# Patient Record
Sex: Female | Born: 1950
Health system: Southern US, Community
[De-identification: ages and names within clinical notes are randomized; demographics above are authoritative.]

## PROBLEM LIST (undated history)

## (undated) DIAGNOSIS — T7840XA Allergy, unspecified, initial encounter: Secondary | ICD-10-CM

## (undated) DIAGNOSIS — K219 Gastro-esophageal reflux disease without esophagitis: Secondary | ICD-10-CM

## (undated) DIAGNOSIS — C801 Malignant (primary) neoplasm, unspecified: Secondary | ICD-10-CM

## (undated) DIAGNOSIS — R011 Cardiac murmur, unspecified: Secondary | ICD-10-CM

## (undated) DIAGNOSIS — I10 Essential (primary) hypertension: Secondary | ICD-10-CM

## (undated) HISTORY — DX: Cardiac murmur, unspecified: R01.1

## (undated) HISTORY — DX: Malignant (primary) neoplasm, unspecified: C80.1

## (undated) HISTORY — DX: Allergy, unspecified, initial encounter: T78.40XA

## (undated) HISTORY — DX: Essential (primary) hypertension: I10

## (undated) HISTORY — DX: Gastro-esophageal reflux disease without esophagitis: K21.9

---

## 1997-08-31 ENCOUNTER — Emergency Department (HOSPITAL_COMMUNITY): Admission: EM | Admit: 1997-08-31 | Discharge: 1997-08-31 | Payer: Self-pay | Admitting: Emergency Medicine

## 1999-05-10 ENCOUNTER — Other Ambulatory Visit: Admission: RE | Admit: 1999-05-10 | Discharge: 1999-05-10 | Payer: Self-pay | Admitting: Obstetrics and Gynecology

## 2000-06-06 ENCOUNTER — Other Ambulatory Visit: Admission: RE | Admit: 2000-06-06 | Discharge: 2000-06-06 | Payer: Self-pay | Admitting: Obstetrics and Gynecology

## 2001-08-13 ENCOUNTER — Other Ambulatory Visit: Admission: RE | Admit: 2001-08-13 | Discharge: 2001-08-13 | Payer: Self-pay | Admitting: Obstetrics and Gynecology

## 2002-09-22 ENCOUNTER — Other Ambulatory Visit: Admission: RE | Admit: 2002-09-22 | Discharge: 2002-09-22 | Payer: Self-pay | Admitting: Obstetrics and Gynecology

## 2003-05-03 ENCOUNTER — Emergency Department (HOSPITAL_COMMUNITY): Admission: EM | Admit: 2003-05-03 | Discharge: 2003-05-03 | Payer: Self-pay | Admitting: Emergency Medicine

## 2003-10-20 ENCOUNTER — Other Ambulatory Visit: Admission: RE | Admit: 2003-10-20 | Discharge: 2003-10-20 | Payer: Self-pay | Admitting: Obstetrics and Gynecology

## 2004-11-15 ENCOUNTER — Other Ambulatory Visit: Admission: RE | Admit: 2004-11-15 | Discharge: 2004-11-15 | Payer: Self-pay | Admitting: Obstetrics and Gynecology

## 2004-11-15 ENCOUNTER — Ambulatory Visit: Payer: Self-pay | Admitting: Internal Medicine

## 2005-07-11 ENCOUNTER — Ambulatory Visit: Payer: Self-pay | Admitting: Internal Medicine

## 2005-07-16 ENCOUNTER — Ambulatory Visit: Payer: Self-pay | Admitting: Internal Medicine

## 2005-10-17 ENCOUNTER — Ambulatory Visit: Payer: Self-pay | Admitting: Internal Medicine

## 2005-10-24 ENCOUNTER — Ambulatory Visit: Payer: Self-pay | Admitting: Internal Medicine

## 2006-01-16 ENCOUNTER — Ambulatory Visit: Payer: Self-pay | Admitting: Internal Medicine

## 2006-04-17 ENCOUNTER — Ambulatory Visit: Payer: Self-pay | Admitting: Internal Medicine

## 2006-07-17 ENCOUNTER — Ambulatory Visit: Payer: Self-pay | Admitting: Internal Medicine

## 2006-11-14 ENCOUNTER — Ambulatory Visit: Payer: Self-pay | Admitting: Internal Medicine

## 2007-03-27 ENCOUNTER — Encounter: Payer: Self-pay | Admitting: Internal Medicine

## 2007-03-27 DIAGNOSIS — J309 Allergic rhinitis, unspecified: Secondary | ICD-10-CM

## 2007-03-27 DIAGNOSIS — I10 Essential (primary) hypertension: Secondary | ICD-10-CM | POA: Insufficient documentation

## 2007-03-27 DIAGNOSIS — J209 Acute bronchitis, unspecified: Secondary | ICD-10-CM

## 2007-05-16 ENCOUNTER — Telehealth: Payer: Self-pay | Admitting: Internal Medicine

## 2008-03-17 ENCOUNTER — Telehealth: Payer: Self-pay | Admitting: Internal Medicine

## 2008-03-22 ENCOUNTER — Ambulatory Visit: Payer: Self-pay | Admitting: Internal Medicine

## 2008-03-23 LAB — CONVERTED CEMR LAB
ALT: 20 units/L (ref 0–35)
AST: 21 units/L (ref 0–37)
Albumin: 3.8 g/dL (ref 3.5–5.2)
Alkaline Phosphatase: 63 units/L (ref 39–117)
BUN: 19 mg/dL (ref 6–23)
Basophils Absolute: 0.1 10*3/uL (ref 0.0–0.1)
Basophils Relative: 0.9 % (ref 0.0–3.0)
Bilirubin Urine: NEGATIVE
Bilirubin, Direct: 0.1 mg/dL (ref 0.0–0.3)
CO2: 26 meq/L (ref 19–32)
Calcium: 9.3 mg/dL (ref 8.4–10.5)
Chloride: 104 meq/L (ref 96–112)
Cholesterol: 109 mg/dL (ref 0–200)
Creatinine, Ser: 1 mg/dL (ref 0.4–1.2)
Crystals: NEGATIVE
Eosinophils Absolute: 0.3 10*3/uL (ref 0.0–0.7)
Eosinophils Relative: 3.9 % (ref 0.0–5.0)
GFR calc Af Amer: 74 mL/min
GFR calc non Af Amer: 61 mL/min
Glucose, Bld: 85 mg/dL (ref 70–99)
HCT: 35.2 % — ABNORMAL LOW (ref 36.0–46.0)
HDL: 55.5 mg/dL (ref >39.0)
Hemoglobin, Urine: NEGATIVE
Hemoglobin: 12.3 g/dL (ref 12.0–15.0)
Hgb A1c MFr Bld: 6.2 % — ABNORMAL HIGH (ref 4.6–6.0)
Ketones, ur: NEGATIVE mg/dL
LDL Cholesterol: 44 mg/dL (ref 0–99)
Lymphocytes Relative: 30.7 % (ref 12.0–46.0)
MCHC: 34.9 g/dL (ref 30.0–36.0)
MCV: 90.2 fL (ref 78.0–100.0)
Monocytes Absolute: 0.5 10*3/uL (ref 0.1–1.0)
Monocytes Relative: 6.3 % (ref 3.0–12.0)
Mucus, UA: NEGATIVE
Neutro Abs: 4.3 10*3/uL (ref 1.4–7.7)
Neutrophils Relative %: 58.2 % (ref 43.0–77.0)
Nitrite: NEGATIVE
Platelets: 240 10*3/uL (ref 150–400)
Potassium: 4.3 meq/L (ref 3.5–5.1)
RBC / HPF: NONE SEEN
RBC: 3.9 M/uL (ref 3.87–5.11)
RDW: 13.5 % (ref 11.5–14.6)
Sodium: 138 meq/L (ref 135–145)
Specific Gravity, Urine: <=1.005 (ref 1.000–1.03)
TSH: 2.7 microintl units/mL (ref 0.35–5.50)
Total Bilirubin: 0.6 mg/dL (ref 0.3–1.2)
Total CHOL/HDL Ratio: 2
Total Protein, Urine: NEGATIVE mg/dL
Total Protein: 6.9 g/dL (ref 6.0–8.3)
Triglycerides: 48 mg/dL (ref 0–149)
Urine Glucose: NEGATIVE mg/dL
Urobilinogen, UA: 0.2 (ref 0.0–1.0)
VLDL: 10 mg/dL (ref 0–40)
WBC: 7.5 10*3/uL (ref 4.5–10.5)
pH: 5.5 (ref 5.0–8.0)

## 2008-03-24 ENCOUNTER — Ambulatory Visit: Payer: Self-pay | Admitting: Internal Medicine

## 2008-03-24 DIAGNOSIS — R7309 Other abnormal glucose: Secondary | ICD-10-CM

## 2008-06-07 ENCOUNTER — Telehealth: Payer: Self-pay | Admitting: Internal Medicine

## 2008-08-05 ENCOUNTER — Telehealth: Payer: Self-pay | Admitting: Internal Medicine

## 2009-05-11 ENCOUNTER — Ambulatory Visit: Payer: Self-pay | Admitting: Internal Medicine

## 2009-05-11 DIAGNOSIS — J040 Acute laryngitis: Secondary | ICD-10-CM | POA: Insufficient documentation

## 2009-05-12 ENCOUNTER — Ambulatory Visit: Payer: Self-pay | Admitting: Internal Medicine

## 2009-05-12 LAB — CONVERTED CEMR LAB
ALT: 16 units/L (ref 0–35)
AST: 19 units/L (ref 0–37)
Albumin: 3.5 g/dL (ref 3.5–5.2)
Alkaline Phosphatase: 59 units/L (ref 39–117)
BUN: 12 mg/dL (ref 6–23)
Basophils Absolute: 0 10*3/uL (ref 0.0–0.1)
Basophils Relative: 0.6 % (ref 0.0–3.0)
Bilirubin Urine: NEGATIVE
Bilirubin, Direct: 0.2 mg/dL (ref 0.0–0.3)
CO2: 28 meq/L (ref 19–32)
Calcium: 9.2 mg/dL (ref 8.4–10.5)
Chloride: 106 meq/L (ref 96–112)
Cholesterol: 94 mg/dL (ref 0–200)
Creatinine, Ser: 0.9 mg/dL (ref 0.4–1.2)
Eosinophils Absolute: 0.3 10*3/uL (ref 0.0–0.7)
Eosinophils Relative: 5 % (ref 0.0–5.0)
GFR calc non Af Amer: 83.1 mL/min (ref >60)
Glucose, Bld: 93 mg/dL (ref 70–99)
HCT: 34.6 % — ABNORMAL LOW (ref 36.0–46.0)
HDL: 56.5 mg/dL (ref >39.00)
Hemoglobin, Urine: NEGATIVE
Hemoglobin: 11.2 g/dL — ABNORMAL LOW (ref 12.0–15.0)
Hgb A1c MFr Bld: 6.3 % (ref 4.6–6.5)
Ketones, ur: NEGATIVE mg/dL
LDL Cholesterol: 31 mg/dL (ref 0–99)
Leukocytes, UA: NEGATIVE
Lymphocytes Relative: 28.5 % (ref 12.0–46.0)
Lymphs Abs: 1.9 10*3/uL (ref 0.7–4.0)
MCHC: 32.4 g/dL (ref 30.0–36.0)
MCV: 90.7 fL (ref 78.0–100.0)
Monocytes Absolute: 0.3 10*3/uL (ref 0.1–1.0)
Monocytes Relative: 4.6 % (ref 3.0–12.0)
Neutro Abs: 4.2 10*3/uL (ref 1.4–7.7)
Neutrophils Relative %: 61.3 % (ref 43.0–77.0)
Nitrite: NEGATIVE
Platelets: 228 10*3/uL (ref 150.0–400.0)
Potassium: 3.8 meq/L (ref 3.5–5.1)
RBC: 3.82 M/uL — ABNORMAL LOW (ref 3.87–5.11)
RDW: 13.1 % (ref 11.5–14.6)
Sodium: 140 meq/L (ref 135–145)
Specific Gravity, Urine: <=1.005 (ref 1.000–1.030)
TSH: 1.74 microintl units/mL (ref 0.35–5.50)
Total Bilirubin: 0.7 mg/dL (ref 0.3–1.2)
Total CHOL/HDL Ratio: 2
Total Protein, Urine: NEGATIVE mg/dL
Total Protein: 7 g/dL (ref 6.0–8.3)
Triglycerides: 32 mg/dL (ref 0.0–149.0)
Urine Glucose: NEGATIVE mg/dL
Urobilinogen, UA: 0.2 (ref 0.0–1.0)
VLDL: 6.4 mg/dL (ref 0.0–40.0)
WBC: 6.7 10*3/uL (ref 4.5–10.5)
pH: 6.5 (ref 5.0–8.0)

## 2009-08-09 ENCOUNTER — Telehealth: Payer: Self-pay | Admitting: Internal Medicine

## 2009-08-09 ENCOUNTER — Encounter: Payer: Self-pay | Admitting: Internal Medicine

## 2010-06-07 ENCOUNTER — Ambulatory Visit: Admit: 2010-06-07 | Payer: Self-pay | Admitting: Internal Medicine

## 2010-06-07 ENCOUNTER — Ambulatory Visit (INDEPENDENT_AMBULATORY_CARE_PROVIDER_SITE_OTHER): Payer: BC Managed Care – PPO | Admitting: Internal Medicine

## 2010-06-07 ENCOUNTER — Other Ambulatory Visit: Payer: Self-pay | Admitting: Internal Medicine

## 2010-06-07 ENCOUNTER — Encounter: Payer: Self-pay | Admitting: Internal Medicine

## 2010-06-07 DIAGNOSIS — I10 Essential (primary) hypertension: Secondary | ICD-10-CM

## 2010-06-07 DIAGNOSIS — J309 Allergic rhinitis, unspecified: Secondary | ICD-10-CM

## 2010-06-07 DIAGNOSIS — R7309 Other abnormal glucose: Secondary | ICD-10-CM

## 2010-06-07 DIAGNOSIS — R635 Abnormal weight gain: Secondary | ICD-10-CM | POA: Insufficient documentation

## 2010-06-07 LAB — BASIC METABOLIC PANEL
BUN: 19 mg/dL (ref 6–23)
CO2: 28 mEq/L (ref 19–32)
Calcium: 9.2 mg/dL (ref 8.4–10.5)
Chloride: 106 mEq/L (ref 96–112)
Creatinine, Ser: 1 mg/dL (ref 0.4–1.2)
GFR: 77.78 mL/min (ref >60.00)
Glucose, Bld: 93 mg/dL (ref 70–99)
Potassium: 4.5 mEq/L (ref 3.5–5.1)
Sodium: 139 mEq/L (ref 135–145)

## 2010-06-07 LAB — HEPATIC FUNCTION PANEL
ALT: 18 U/L (ref 0–35)
AST: 18 U/L (ref 0–37)
Albumin: 3.6 g/dL (ref 3.5–5.2)
Alkaline Phosphatase: 59 U/L (ref 39–117)
Bilirubin, Direct: 0.1 mg/dL (ref 0.0–0.3)
Total Bilirubin: 0.3 mg/dL (ref 0.3–1.2)
Total Protein: 6.7 g/dL (ref 6.0–8.3)

## 2010-06-07 LAB — HEMOGLOBIN A1C: Hgb A1c MFr Bld: 6.5 % (ref 4.6–6.5)

## 2010-06-07 LAB — TSH: TSH: 1.94 u[IU]/mL (ref 0.35–5.50)

## 2010-06-08 NOTE — Assessment & Plan Note (Signed)
Summary: MEDS---FU---STC     Vital Signs:  Patient profile:   60 year old female Height:      68 inches Weight:      206 pounds BMI:     31.44 Temp:     97.9 degrees F oral Pulse rate:   53 / minute BP sitting:   134 / 82  (left arm)  Vitals Entered By: Tora Perches (May 11, 2009 9:32 AM) CC: f/u Is Patient Diabetic? No   CC:  f/u.  History of Present Illness: F/u HTN and asthma The patient presents with complaints of sore throat, fever, cough, sinus congestion and drainge of several days duration. Not better with OTC meds. Hoarse.Muscle aches are present.  The mucus is colored.   Preventive Screening-Counseling & Management  Alcohol-Tobacco     Smoking Status: never  Current Medications (verified): 1)  Spironolactone 100 Mg Tabs (Spironolactone) .Marland Kitchen.. 1 By Mouth Qd 2)  Advair Diskus 250-50 Mcg/dose Misc (Fluticasone-Salmeterol) .Marland Kitchen.. 1 Puff 2 Times Daily 3)  Allegra 180 Mg Tabs (Fexofenadine Hcl) .Marland Kitchen.. 1 By Mouth Once Daily Prn 4)  Vitamin D3 1000 Unit  Tabs (Cholecalciferol) .Marland Kitchen.. 1 By Mouth Daily 5)  Amlodipine Besylate 10 Mg  Tabs (Amlodipine Besylate) .Marland Kitchen.. 1 Once Daily 6)  Carvedilol 25 Mg Tabs (Carvedilol) .Marland Kitchen.. 1 By Mouth Bid  Allergies: 1)  ! Ace Inhibitors 2)  ! 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    Physical Exam  General:  Well-developed,well-nourished,in no acute distress; alert,appropriate and cooperative throughout; sonme examinationoverweight-appearing.   Mouth:  Erythematous throat mucosa and intranasal erythema.  Lungs:  Normal respiratory effort, chest expands symmetrically. Lungs are clear to auscultation, no crackles or wheezes. Heart:  Normal rate and regular rhythm. S1 and S2 normal without gallop, murmur,  click, rub or other extra sounds. Msk:  No deformity or scoliosis noted of thoracic or lumbar spine.   Extremities:  No clubbing, cyanosis, edema, or deformity noted with normal full range of motion of all joints.   Neurologic:  No cranial nerve deficits noted. Station and gait are normal. Plantar reflexes are down-going bilaterally. DTRs are symmetrical throughout. Sensory, motor and coordinative functions appear intact. Skin:  Intact without suspicious lesions or rashes Psych:  Cognition and judgment appear intact. Alert and cooperative with normal attention span and concentration. No apparent delusions, illusions, hallucinations   Impression & Recommendations:  Problem # 1:  HYPERTENSION (ICD-401.9) Assessment Unchanged  Her updated medication list for this problem includes:    Spironolactone 100 Mg Tabs (Spironolactone) .Marland Kitchen... 1 by mouth qd    Amlodipine Besylate 10 Mg Tabs (Amlodipine besylate) .Marland Kitchen... 1 once daily    Carvedilol 25 Mg Tabs (Carvedilol) .Marland Kitchen... 1 by mouth bid  BP today: 134/82 Prior BP: 130/100 (03/24/2008)  Labs Reviewed: K+: 4.3 (03/22/2008) Creat: : 1.0 (03/22/2008)   Chol: 109 (03/22/2008)   HDL: 55.5 (03/22/2008)   LDL: 44 (03/22/2008)   TG: 48 (03/22/2008)  Problem # 2:  ASTHMATIC BRONCHITIS, ACUTE (ICD-466.0) Assessment: Deteriorated  Her updated medication list for this problem includes:    Advair Diskus 250-50 Mcg/dose Misc (Fluticasone-salmeterol) .Marland Kitchen... 1 puff 2 times daily    Zithromax Z-pak 250 Mg Tabs (Azithromycin) .Marland Kitchen... As dirrected  Problem # 3:  ALLERGIC RHINITIS (ICD-477.9) Assessment: Deteriorated  The following medications were removed  from the medication list:    Allegra 180 Mg Tabs (Fexofenadine hcl) .Marland Kitchen... 1 by mouth once daily prn Her updated medication list for this problem includes:    Loratadine 10 Mg Tabs (Loratadine) .Marland Kitchen... 1 by mouth once daily as needed allergies  Problem # 4:  LARYNGITIS, ACUTE (ICD-464.00) Assessment:  New  Complete Medication List: 1)  Spironolactone 100 Mg Tabs (Spironolactone) .Marland Kitchen.. 1 by mouth qd 2)  Advair Diskus 250-50 Mcg/dose Misc (Fluticasone-salmeterol) .Marland Kitchen.. 1 puff 2 times daily 3)  Vitamin D3 1000 Unit Tabs (Cholecalciferol) .Marland Kitchen.. 1 by mouth daily 4)  Amlodipine Besylate 10 Mg Tabs (Amlodipine besylate) .Marland Kitchen.. 1 once daily 5)  Carvedilol 25 Mg Tabs (Carvedilol) .Marland Kitchen.. 1 by mouth bid 6)  Zithromax Z-pak 250 Mg Tabs (Azithromycin) .... As dirrected 7)  Loratadine 10 Mg Tabs (Loratadine) .Marland Kitchen.. 1 by mouth once daily as needed allergies  Contraindications/Deferment of Procedures/Staging:    Treatment: Flu Shot    Contraindication: N/A     Test/Procedure: Pneumovax vaccine    Reason for deferment: patient declined   Patient Instructions: 1)  Try to eat more raw plant food, fresh and dry fruit, raw almonds, leafy vegetables, whole foods and less red meat, less animal fat. Poultry and fish is better for you than pork and beef. Avoid processed foods (canned soups, hot dogs, sausage, bacon , frozen dinners). Avoid corn syrup, high fructose syrup or aspartam and Splenda  containing drinks. Honey, Agave and Stevia are better sweeteners. Make your own  dressing with olive oil, wine vinegar, lemon juce, garlic etc. for your salads. 2)  Voice rest 3)  Use stretching exercises that I have provided (15 min. or longer every day) 4)  Labs next wk 5)  BMP prior to visit, ICD-9: 6)  Hepatic Panel prior to visit, ICD-9: 7)  Lipid Panel prior to visit, ICD-9: 8)  TSH prior to visit, ICD-9: 9)  CBC w/ Diff prior to visit, ICD-9: 10)  Urine-dip prior to visit, ICD-9: 11)  HbgA1C prior to visit, ICD-9: 12)  V70.0 Prescriptions: LORATADINE 10 MG TABS (LORATADINE) 1 by mouth once daily as needed allergies  #90 x 3   Entered and Authorized by:   Tresa Garter MD   Signed by:   Tresa Garter MD on 05/11/2009   Method used:   Print then Give to Patient   RxID:   [id number] CARVEDILOL 25  MG TABS (CARVEDILOL) 1 by mouth bid  #180 x 3   Entered and Authorized by:   Tresa Garter MD   Signed by:   Tresa Garter MD on 05/11/2009   Method used:   Print then Give to Patient   RxID:   [id number] AMLODIPINE BESYLATE 10 MG  TABS (AMLODIPINE BESYLATE) 1 once daily  #90 x 3   Entered and Authorized by:   Tresa Garter MD   Signed by:   Tresa Garter MD on 05/11/2009   Method used:   Print then Give to Patient   RxID:   [id number] ADVAIR DISKUS 250-50 MCG/DOSE MISC (FLUTICASONE-SALMETEROL) 1 puff 2 times daily  #3 x 3   Entered and Authorized by:   Tresa Garter MD   Signed by:   Tresa Garter MD on 05/11/2009   Method used:   Print then Give to Patient   RxID:   [id number] SPIRONOLACTONE 100 MG TABS (SPIRONOLACTONE) 1 by mouth qd  #90 x 3   Entered and Authorized by:   Georgina Quint  Plotnikov MD   Signed by:   Tresa Garter MD on 05/11/2009   Method used:   Print then Give to Patient   RxID:   [id number] ZITHROMAX Z-PAK 250 MG TABS (AZITHROMYCIN) as dirrected  #1 x 0   Entered and Authorized by:   Tresa Garter MD   Signed by:   Tresa Garter MD on 05/11/2009   Method used:   Print then Give to Patient   RxID:   [id number]

## 2010-06-08 NOTE — Progress Notes (Signed)
Summary: ALLERGY LETTER  Phone Note Call from Patient Call back at [phone] till 11:30   Summary of Call: Patient is requesting a letter that she suffers from allergies w/her triggers, for her file at work. (Triggers she says are "fuzzy stuff") Initial call taken by: Lamar Sprinkles, CMA,  August 09, 2009 10:26 AM  Follow-up for Phone Call        OK Follow-up by: Tresa Garter MD,  August 09, 2009 1:15 PM  Additional Follow-up for Phone Call Additional follow up Details #1::        Letter ready, left mess to call office back..................Marland KitchenLamar Sprinkles, CMA  August 09, 2009 2:36 PM     Additional Follow-up for Phone Call Additional follow up Details #2::    left message on machine to call back to office. Lucious Groves  August 10, 2009 9:22 AM   patient notified. Lucious Groves  August 11, 2009 8:27 AM

## 2010-06-08 NOTE — Letter (Signed)
Summary: Generic Letter  [facility]  [address] [place], [place] [address]   Phone: [phone]  Fax: [phone]    08/09/2009  RE: LAKYN ALSTEEN [address] [place], [place]  [address]    Ms. Betton si suffering with chronic allergic rhinitis. Some allergy triggers are present at her work place.           Sincerely,   Jacinta Shoe MD

## 2010-06-14 NOTE — Assessment & Plan Note (Signed)
Summary: FOLLOW UP  LB   Vital Signs:  Patient profile:   60 year old female Height:      68 inches Weight:      223 pounds BMI:     34.03 Temp:     98.3 degrees F oral Pulse rate:   76 / minute Pulse rhythm:   regular Resp:     16 per minute BP sitting:   110 / 68  (left arm) Cuff size:   regular  Vitals Entered By: Lanier Prude, CMA(AAMA) (June 07, 2010 9:30 AM) CC: f/u  Comments pt needs Rf on Spironolactone.  She is not taking Loratadine   CC:  f/u .  History of Present Illness: The patient presents for a follow up of hypertension, allergies, asthma. C/o stress.  Current Medications (verified): 1)  Spironolactone 100 Mg Tabs (Spironolactone) .Marland Kitchen.. 1 By Mouth Qd 2)  Advair Diskus 250-50 Mcg/dose Misc (Fluticasone-Salmeterol) .Marland Kitchen.. 1 Puff 2 Times Daily 3)  Vitamin D3 1000 Unit  Tabs (Cholecalciferol) .Marland Kitchen.. 1 By Mouth Daily 4)  Amlodipine Besylate 10 Mg  Tabs (Amlodipine Besylate) .Marland Kitchen.. 1 Once Daily 5)  Carvedilol 25 Mg Tabs (Carvedilol) .Marland Kitchen.. 1 By Mouth Bid 6)  Loratadine 10 Mg Tabs (Loratadine) .Marland Kitchen.. 1 By Mouth Once Daily As Needed Allergies  Allergies (verified): 1)  ! Ace Inhibitors 2)  ! 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   Eyes:  No corneal or conjunctival inflammation noted. EOMI. Perrla. Mouth:  Erythematous throat mucosa and intranasal erythema.  Lungs:  Normal respiratory effort, chest expands symmetrically. Lungs are clear to auscultation, no crackles or wheezes. Heart:  Normal rate and regular rhythm. S1 and S2 normal without gallop, murmur, click, rub or other extra sounds. Msk:  No deformity or scoliosis noted of thoracic or lumbar spine.     Neurologic:  No cranial nerve deficits noted. Station and gait are normal. Plantar reflexes are down-going bilaterally. DTRs are symmetrical throughout. Sensory, motor and coordinative functions appear intact. Skin:  Intact without suspicious lesions or rashes Psych:  Cognition and judgment appear intact. Alert and cooperative with normal attention span and concentration. No apparent delusions, illusions, hallucinations   Impression & Recommendations:  Problem # 1:  HYPERTENSION (ICD-401.9) Assessment Improved  The following medications were removed from the medication list:    Spironolactone 100 Mg Tabs (Spironolactone) .Marland Kitchen... 1 by mouth qd Her updated medication list for this problem includes:    Amlodipine Besylate 10 Mg Tabs (Amlodipine besylate) .Marland Kitchen... 1 once daily    Carvedilol 25 Mg Tabs (Carvedilol) .Marland Kitchen... 1 by mouth bid  Orders: TLB-BMP (Basic Metabolic Panel-BMET) (80048-METABOL) TLB-A1C / Hgb A1C (Glycohemoglobin) (83036-A1C) TLB-Hepatic/Liver Function Pnl (80076-HEPATIC) TLB-TSH (Thyroid Stimulating Hormone) (84443-TSH)  Problem # 2:  ABNORMAL GLUCOSE NEC (ICD-790.29) Assessment: Comment Only  Orders: TLB-BMP (Basic Metabolic Panel-BMET) (80048-METABOL) TLB-A1C / Hgb A1C (Glycohemoglobin) (83036-A1C) TLB-Hepatic/Liver Function Pnl (80076-HEPATIC) TLB-TSH (Thyroid Stimulating Hormone) (84443-TSH)  Problem # 3:  ALLERGIC RHINITIS (ICD-477.9) Assessment: Improved  Her updated medication list for this problem includes:    Loratadine 10 Mg Tabs (Loratadine) .Marland Kitchen... 1 by mouth once daily as needed allergies  Orders: TLB-BMP (Basic Metabolic Panel-BMET) (80048-METABOL) TLB-A1C / Hgb A1C (Glycohemoglobin) (83036-A1C) TLB-Hepatic/Liver Function Pnl (80076-HEPATIC) TLB-TSH (Thyroid Stimulating Hormone) (84443-TSH)  Problem # 4:  WEIGHT GAIN, ABNORMAL (  ICD-783.1)/obesity Assessment: Deteriorated She wants to try Phentermine Risks vs benefits and controversies of a long  term Phentermine use were discussed.   Complete Medication List: 1)  Amlodipine Besylate 10 Mg Tabs (Amlodipine besylate) .Marland Kitchen.. 1 once daily 2)  Carvedilol 25 Mg Tabs (Carvedilol) .Marland Kitchen.. 1 by mouth bid 3)  Loratadine 10 Mg Tabs (Loratadine) .Marland Kitchen.. 1 by mouth once daily as needed allergies 4)  Vitamin D3 1000 Unit Tabs (Cholecalciferol) .Marland Kitchen.. 1 by mouth daily 5)  Phentermine Hcl 37.5 Mg Tabs (Phentermine hcl) .Marland Kitchen.. 1 by mouth qam  Patient Instructions: 1)  Please schedule a follow-up appointment in 3 months well w/labs. Prescriptions: PHENTERMINE HCL 37.5 MG TABS (PHENTERMINE HCL) 1 by mouth qam  #30 x 2   Entered and Authorized by:   Tresa Garter MD   Signed by:   Tresa Garter MD on 06/07/2010   Method used:   Print then Give to Patient   RxID:   [id number] LORATADINE 10 MG TABS (LORATADINE) 1 by mouth once daily as needed allergies  #90 x 3   Entered and Authorized by:   Tresa Garter MD   Signed by:   Tresa Garter MD on 06/07/2010   Method used:   Print then Give to Patient   RxID:   [id number] CARVEDILOL 25 MG TABS (CARVEDILOL) 1 by mouth bid  #60 x 11   Entered and Authorized by:   Tresa Garter MD   Signed by:   Tresa Garter MD on 06/07/2010   Method used:   Electronically to        [facility] [address] [phone]* (retail)       [address]       [place], [place]  [address]       Ph: [phone]       Fax: [phone]   RxID:   [id number] AMLODIPINE BESYLATE 10 MG  TABS (AMLODIPINE BESYLATE) 1 once daily  #30 x 11   Entered and Authorized by:   Tresa Garter MD   Signed by:   Tresa Garter MD on 06/07/2010   Method used:   Electronically to        [facility] [address] [phone]* (retail)       [address]       [place], [place]  [address]       Ph: [phone]       Fax: [phone]   RxID:   [id number]    Orders Added: 1)  Est. Patient Level IV [07371] 2)  TLB-BMP (Basic Metabolic Panel-BMET)  [80048-METABOL] 3)  TLB-A1C / Hgb A1C (Glycohemoglobin) [83036-A1C] 4)  TLB-Hepatic/Liver Function Pnl [80076-HEPATIC] 5)  TLB-TSH (Thyroid Stimulating Hormone) [06269-SWN]

## 2010-08-15 ENCOUNTER — Telehealth: Payer: Self-pay | Admitting: *Deleted

## 2010-08-15 NOTE — Telephone Encounter (Signed)
Pt c/o chest congestion. No cough, fever, body aches, chills or pain. She is taking otc mucus relief and allegra D which has helped some today. I advised her to continue what she is taking now, rest and call office if no better or if worse. She agreed.

## 2010-08-15 NOTE — Telephone Encounter (Signed)
Agree. Thx 

## 2010-08-21 ENCOUNTER — Telehealth: Payer: Self-pay | Admitting: *Deleted

## 2010-08-21 NOTE — Telephone Encounter (Signed)
See previous phone note, pt states she is feeling worse and req rx for abx from MD. OTC med have given no relief.

## 2010-08-21 NOTE — Telephone Encounter (Signed)
Not sure... Needs OV Thx

## 2010-08-22 ENCOUNTER — Telehealth: Payer: Self-pay | Admitting: Internal Medicine

## 2010-08-22 NOTE — Telephone Encounter (Signed)
Called and left message w/Pt's mother to inform OV needed.

## 2010-08-22 NOTE — Telephone Encounter (Signed)
Duplicate. Error

## 2010-08-23 ENCOUNTER — Encounter: Payer: Self-pay | Admitting: Internal Medicine

## 2010-08-23 ENCOUNTER — Ambulatory Visit (INDEPENDENT_AMBULATORY_CARE_PROVIDER_SITE_OTHER): Payer: BC Managed Care – PPO | Admitting: Internal Medicine

## 2010-08-23 DIAGNOSIS — J069 Acute upper respiratory infection, unspecified: Secondary | ICD-10-CM

## 2010-08-27 ENCOUNTER — Encounter: Payer: Self-pay | Admitting: Internal Medicine

## 2010-08-27 NOTE — Progress Notes (Signed)
  Subjective:    Patient ID: Molly Newman, female    DOB: 1950/07/15, 60 y.o.   MRN: [id number]  HPI  C/o nasal congestion, ST and cough x 1 wk  HPI  C/o URI sx's x several days. C/o ST, cough, weakness. Not better with OTC medicines. Actually, the patient is getting worse. The patient did not sleep last night due to cough.  Review of Systems  Constitutional: Positive for fever, chills and fatigue.  HENT: Positive for congestion, rhinorrhea, sneezing and postnasal drip.   Eyes: Positive for photophobia and pain. Negative for discharge and visual disturbance.  Respiratory: Positive for cough, shortness of breath and wheezing.   Cardiovascular: Positive for chest pain.  Gastrointestinal: Negative for vomiting, abdominal pain, diarrhea and abdominal distention.  Genitourinary: Negative for dysuria and difficulty urinating.  Skin: Negative for rash.  Neurological: Positive for dizziness, weakness and light-headedness.     Review of Systems     Objective:   Physical Exam        Assessment & Plan:  Physical Exam  Constitutional: The patient appears well-developed and well-nourished. The patient  appears mildely distressed .       The patient looks ill with a "cold", coughing  HENT:  Right Ear: Tympanic membrane is injected.  Left Ear: Tympanic membrane is injected.  Nose: Mucosal edema and rhinorrhea present. No epistaxis. Right sinus exhibits maxillary sinus tenderness. Left sinus exhibits maxillary sinus tenderness.  Mouth/Throat: No oropharyngeal exudate.  Eyes: Right eye exhibits no discharge. Left eye exhibits no discharge.       Erythematous throat  Neck: Neck supple. No JVD present.  Cardiovascular: Normal rate.  Exam reveals no gallop.   No murmur heard. Pulmonary/Chest: No accessory muscle usage. No respiratory distress. The patient  has wheezes. There is no rales. The patient exhibits no tenderness.  Abdominal: There is no distension or pain.    Lymphadenopathy:    The patient  has cervical adenopathy.  Skin: No rash noted. The patient  Is not diaphoretic.  Psychiatric: The patient's behavior is normal.   Upper respiratory infection See meds

## 2010-09-03 ENCOUNTER — Encounter: Payer: Self-pay | Admitting: Internal Medicine

## 2010-09-03 DIAGNOSIS — J069 Acute upper respiratory infection, unspecified: Secondary | ICD-10-CM | POA: Insufficient documentation

## 2010-09-03 NOTE — Assessment & Plan Note (Signed)
See meds 

## 2010-09-08 ENCOUNTER — Ambulatory Visit (INDEPENDENT_AMBULATORY_CARE_PROVIDER_SITE_OTHER)
Admission: RE | Admit: 2010-09-08 | Discharge: 2010-09-08 | Disposition: A | Discharge status: Home / Self Care | Payer: BC Managed Care – PPO | Source: Ambulatory Visit | Attending: Endocrinology | Admitting: Endocrinology

## 2010-09-08 ENCOUNTER — Ambulatory Visit (INDEPENDENT_AMBULATORY_CARE_PROVIDER_SITE_OTHER): Payer: BC Managed Care – PPO | Admitting: Endocrinology

## 2010-09-08 ENCOUNTER — Encounter: Payer: Self-pay | Admitting: Endocrinology

## 2010-09-08 VITALS — BP 106/70 | HR 61 | Temp 97.9°F | Ht 68.0 in | Wt 225.4 lb

## 2010-09-08 DIAGNOSIS — R05 Cough: Secondary | ICD-10-CM

## 2010-09-08 MED ORDER — BENZONATATE 200 MG PO CAPS: 200.0000 mg | ORAL_CAPSULE | Freq: Three times a day (TID) | ORAL | Status: DC | PRN

## 2010-09-08 MED ORDER — AZITHROMYCIN 500 MG PO TABS: 500.0000 mg | ORAL_TABLET | Freq: Every day | ORAL | Status: AC

## 2010-09-08 NOTE — Progress Notes (Signed)
  Subjective:    Patient ID: Molly Newman, female    DOB: Jan 22, 1951, 60 y.o.   MRN: [id number]  HPI Pt states few mos of dry-quality cough in the chest, and assoc pain.   Past Medical History  Diagnosis Date  . Allergy   . Hypertension     No past surgical history on file.  History   Social History  . Marital Status: Single    Spouse Name: N/A    Number of Children: N/A  . Years of Education: N/A   Occupational History  . Not on file.   Social History Main Topics  . Smoking status: Never Smoker   . Smokeless tobacco: Not on file  . Alcohol Use:   . Drug Use:   . Sexually Active:    Other Topics Concern  . Not on file   Social History Narrative  . No narrative on file    Current Outpatient Prescriptions on File Prior to Visit  Medication Sig Dispense Refill  . amLODipine (NORVASC) 10 MG tablet Take 10 mg by mouth daily.        . carvedilol (COREG) 25 MG tablet Take 25 mg by mouth 2 (two) times daily with a meal.        . Cholecalciferol (VITAMIN D3) 1000 UNITS tablet Take 1,000 Units by mouth daily.        . fexofenadine-pseudoephedrine (ALLEGRA-D 24) 180-240 MG per 24 hr tablet Take 1 tablet by mouth daily.        Marland Kitchen loratadine (CLARITIN) 10 MG tablet Take 10 mg by mouth daily.        . phentermine 37.5 MG capsule Take 37.5 mg by mouth every morning.          Allergies  Allergen Reactions  . Ace Inhibitors   . Diltiazem Hcl     REACTION: Hair Loss    Family History  Problem Relation Age of Onset  . Hypertension Other     BP 106/70  Pulse 61  Temp(Src) 97.9 F (36.6 C) (Oral)  Ht 5\' 8"  (1.727 m)  Wt 225 lb 6.4 oz (102.241 kg)  BMI 34.27 kg/m2  SpO2 97%    Review of Systems Denies fever, but she has some wheezing.      Objective:   Physical Exam GENERAL: no distress.  Obese head: no deformity eyes: no periorbital swelling, no proptosis external nose and ears are normal mouth: no lesion seen Ears:  eac's and tm's are better LUNGS:   Clear to auscultation       Assessment & Plan:  Persistent cough, and mild asthma

## 2010-09-08 NOTE — Patient Instructions (Addendum)
here is a sample of "advair-45."  take 1 puff 2x a day.  rinse mouth after using.  Later today, please call [phone] to hear your test results.  You will be prompted to enter the 9-digit "MRN" number that appears at the top left of this page, followed by #.  Then you will hear the message.   i have sent prescriptions for cough pills, and an antibiotic to your pharmacy. I hope feel better soon.  If you don't feel better by next week, please call your doctor. You should always avoid those who smoke. (update: i left message on phone-tree:  rx as we discussed)

## 2010-09-11 ENCOUNTER — Telehealth: Payer: Self-pay | Admitting: Internal Medicine

## 2010-09-11 NOTE — Telephone Encounter (Signed)
No need to do anything. It is OK. Cont your meds.

## 2010-09-11 NOTE — Telephone Encounter (Signed)
Pt was informed that her heart is enlarged based on recent CXR dated 09-08-10. Please advise does pt need any further evaluation/treatment?   If she needs any further tests she request they be done after 09-20-10 as she will be out of town prior to that day.

## 2010-09-11 NOTE — Telephone Encounter (Signed)
Pt requests a call from Dr Posey Rea regarding her enlarged heart.

## 2010-09-12 NOTE — Telephone Encounter (Signed)
Left mess for patient to call back.  

## 2010-09-14 NOTE — Telephone Encounter (Signed)
Left mess for patient to call back.  

## 2010-09-15 NOTE — Telephone Encounter (Signed)
Patient informed. 

## 2010-09-25 ENCOUNTER — Other Ambulatory Visit: Payer: Self-pay | Admitting: Endocrinology

## 2010-09-25 NOTE — Telephone Encounter (Signed)
Pt requesting refill on Benzonatate 100mg  caps

## 2010-09-25 NOTE — Telephone Encounter (Signed)
Verify no fever or sob If none, refill x 1.  If so, ov is needed

## 2010-10-04 ENCOUNTER — Ambulatory Visit: Payer: BC Managed Care – PPO | Admitting: Internal Medicine

## 2010-10-13 ENCOUNTER — Other Ambulatory Visit: Payer: Self-pay | Admitting: Endocrinology

## 2010-10-19 ENCOUNTER — Other Ambulatory Visit: Payer: Self-pay | Admitting: Endocrinology

## 2010-10-19 NOTE — Telephone Encounter (Signed)
Pt saw Dr. Everardo All for cold about a month ago, she is requesting another refill of ATB med-she states she is going out of town this weekend-please advise

## 2010-10-26 ENCOUNTER — Other Ambulatory Visit: Payer: Self-pay | Admitting: Endocrinology

## 2010-11-23 ENCOUNTER — Emergency Department (HOSPITAL_COMMUNITY)
Admission: EM | Admit: 2010-11-23 | Discharge: 2010-11-23 | Disposition: A | Discharge status: Home / Self Care | Payer: BC Managed Care – PPO | Attending: Emergency Medicine | Admitting: Emergency Medicine

## 2010-11-23 DIAGNOSIS — I10 Essential (primary) hypertension: Secondary | ICD-10-CM | POA: Insufficient documentation

## 2010-11-23 DIAGNOSIS — R112 Nausea with vomiting, unspecified: Secondary | ICD-10-CM | POA: Insufficient documentation

## 2010-11-23 DIAGNOSIS — R42 Dizziness and giddiness: Secondary | ICD-10-CM | POA: Insufficient documentation

## 2010-11-23 LAB — URINALYSIS, ROUTINE W REFLEX MICROSCOPIC
Bilirubin Urine: NEGATIVE
Glucose, UA: NEGATIVE mg/dL
Hgb urine dipstick: NEGATIVE
Ketones, ur: NEGATIVE mg/dL
Nitrite: NEGATIVE
Protein, ur: NEGATIVE mg/dL
Specific Gravity, Urine: 1.012 (ref 1.005–1.030)
Urobilinogen, UA: 0.2 mg/dL (ref 0.0–1.0)
pH: 7.5 (ref 5.0–8.0)

## 2010-11-23 LAB — URINE MICROSCOPIC-ADD ON

## 2011-06-21 ENCOUNTER — Other Ambulatory Visit: Payer: Self-pay | Admitting: *Deleted

## 2011-06-21 MED ORDER — CARVEDILOL 25 MG PO TABS: 25.0000 mg | ORAL_TABLET | Freq: Two times a day (BID) | ORAL | Status: DC

## 2011-06-22 ENCOUNTER — Other Ambulatory Visit: Payer: Self-pay | Admitting: *Deleted

## 2011-06-22 MED ORDER — AMLODIPINE BESYLATE 10 MG PO TABS: 10.0000 mg | ORAL_TABLET | Freq: Every day | ORAL | Status: DC

## 2011-11-13 ENCOUNTER — Ambulatory Visit: Payer: BC Managed Care – PPO | Admitting: Internal Medicine

## 2011-11-16 ENCOUNTER — Encounter: Payer: Self-pay | Admitting: Internal Medicine

## 2011-11-16 ENCOUNTER — Ambulatory Visit (INDEPENDENT_AMBULATORY_CARE_PROVIDER_SITE_OTHER): Payer: BC Managed Care – PPO | Admitting: Internal Medicine

## 2011-11-16 VITALS — BP 124/80 | HR 80 | Temp 98.7°F | Resp 16 | Ht 68.0 in | Wt 231.0 lb

## 2011-11-16 DIAGNOSIS — R739 Hyperglycemia, unspecified: Secondary | ICD-10-CM

## 2011-11-16 DIAGNOSIS — R05 Cough: Secondary | ICD-10-CM

## 2011-11-16 DIAGNOSIS — R635 Abnormal weight gain: Secondary | ICD-10-CM

## 2011-11-16 DIAGNOSIS — R1310 Dysphagia, unspecified: Secondary | ICD-10-CM | POA: Insufficient documentation

## 2011-11-16 DIAGNOSIS — R7309 Other abnormal glucose: Secondary | ICD-10-CM

## 2011-11-16 DIAGNOSIS — Z0000 Encounter for general adult medical examination without abnormal findings: Secondary | ICD-10-CM

## 2011-11-16 DIAGNOSIS — I10 Essential (primary) hypertension: Secondary | ICD-10-CM

## 2011-11-16 DIAGNOSIS — E049 Nontoxic goiter, unspecified: Secondary | ICD-10-CM

## 2011-11-16 MED ORDER — AMLODIPINE BESYLATE 10 MG PO TABS: 10.0000 mg | ORAL_TABLET | Freq: Every day | ORAL | Status: DC

## 2011-11-16 MED ORDER — GUAIFENESIN-CODEINE 300-10 MG/5ML PO LIQD: ORAL | Status: DC

## 2011-11-16 MED ORDER — OMEPRAZOLE 40 MG PO CPDR: 40.0000 mg | DELAYED_RELEASE_CAPSULE | Freq: Every day | ORAL | Status: DC

## 2011-11-16 MED ORDER — CARVEDILOL 25 MG PO TABS: 25.0000 mg | ORAL_TABLET | Freq: Two times a day (BID) | ORAL | Status: DC

## 2011-11-16 NOTE — Progress Notes (Signed)
Subjective:    Patient ID: Molly Newman, female    DOB: 1950/08/26, 61 y.o.   MRN: [id number]  HPI  The patient is here for a wellness exam. The patient has been doing well overall without major physical or psychological issues going on lately. The patient needs to address  chronic hypertension that has been well controlled with medicines; C/o cough, mucus, GERD  Wt Readings from Last 3 Encounters:  11/16/11 231 lb (104.781 kg)  09/08/10 225 lb 6.4 oz (102.241 kg)  08/23/10 221 lb (100.245 kg)   . BP Readings from Last 3 Encounters:  11/16/11 124/80  09/08/10 106/70  08/23/10 130/78      Review of Systems  Constitutional: Negative for fever, chills, diaphoresis, activity change, appetite change, fatigue and unexpected weight change.  HENT: Negative for hearing loss, ear pain, congestion, sore throat, sneezing, mouth sores, neck pain, dental problem, voice change, postnasal drip and sinus pressure.   Eyes: Negative for pain and visual disturbance.  Respiratory: Negative for cough, chest tightness, wheezing and stridor.   Cardiovascular: Negative for chest pain, palpitations and leg swelling.  Gastrointestinal: Negative for nausea, vomiting, abdominal pain, blood in stool, abdominal distention and rectal pain.  Genitourinary: Negative for dysuria, hematuria, decreased urine volume, vaginal bleeding, vaginal discharge, difficulty urinating, vaginal pain and menstrual problem.  Musculoskeletal: Negative for back pain, joint swelling and gait problem.  Skin: Negative for color change, rash and wound.  Neurological: Negative for dizziness, tremors, syncope, speech difficulty and light-headedness.  Hematological: Negative for adenopathy.  Psychiatric/Behavioral: Negative for suicidal ideas, hallucinations, behavioral problems, confusion, disturbed wake/sleep cycle, dysphoric mood and decreased concentration. The patient is not hyperactive.        Objective:   Physical Exam    Constitutional: She appears well-developed. No distress.  HENT:  Head: Normocephalic.  Right Ear: External ear normal.  Left Ear: External ear normal.  Nose: Nose normal.  Mouth/Throat: Oropharynx is clear and moist.  Eyes: Conjunctivae are normal. Pupils are equal, round, and reactive to light. Right eye exhibits no discharge. Left eye exhibits no discharge.  Neck: Normal range of motion. Neck supple. No JVD present. No tracheal deviation present. No thyromegaly present.  Cardiovascular: Normal rate, regular rhythm and normal heart sounds.   Pulmonary/Chest: No stridor. No respiratory distress. She has no wheezes.  Abdominal: Soft. Bowel sounds are normal. She exhibits no distension and no mass. There is no tenderness. There is no rebound and no guarding.  Musculoskeletal: She exhibits no edema and no tenderness.  Lymphadenopathy:    She has no cervical adenopathy.  Neurological: She displays normal reflexes. No cranial nerve deficit. She exhibits normal muscle tone. Coordination normal.  Skin: No rash noted. No erythema.  Psychiatric: She has a normal mood and affect. Her behavior is normal. Judgment and thought content normal.   Lab Results  Component Value Date   WBC 6.7 05/12/2009   HGB 11.2* 05/12/2009   HCT 34.6* 05/12/2009   PLT 228.0 05/12/2009   GLUCOSE 93 06/07/2010   CHOL 94 05/12/2009   TRIG 32.0 05/12/2009   HDL 56.50 05/12/2009   LDLCALC 31 05/12/2009   ALT 18 06/07/2010   AST 18 06/07/2010   NA 139 06/07/2010   K 4.5 06/07/2010   CL 106 06/07/2010   CREATININE 1.0 06/07/2010   BUN 19 06/07/2010   CO2 28 06/07/2010   TSH 1.94 06/07/2010   HGBA1C 6.5 06/07/2010          Assessment & Plan:

## 2011-11-16 NOTE — Assessment & Plan Note (Addendum)
We discussed age appropriate health related issues, including available/recomended screening tests and vaccinations. We discussed a need for adhering to healthy diet and exercise. Labs/EKG were reviewed/ordered. All questions were answered. Colonoscopy PAP w/GYN Vaccines discussed

## 2011-11-16 NOTE — Assessment & Plan Note (Signed)
Continue with current prescription therapy as reflected on the Med list.  

## 2011-11-17 ENCOUNTER — Encounter: Payer: Self-pay | Admitting: Internal Medicine

## 2011-11-17 NOTE — Assessment & Plan Note (Signed)
CBG checked  

## 2011-11-17 NOTE — Assessment & Plan Note (Signed)
Omeprazole to start  GI consult

## 2011-11-17 NOTE — Assessment & Plan Note (Signed)
Due to GERD and mucus collection Start Omeprasole GI consult CXR if not better Rx cough syrup

## 2011-11-17 NOTE — Assessment & Plan Note (Signed)
Diet discussed 

## 2011-11-17 NOTE — Assessment & Plan Note (Signed)
Abd US 

## 2011-11-21 ENCOUNTER — Telehealth: Payer: Self-pay | Admitting: Internal Medicine

## 2011-11-21 ENCOUNTER — Other Ambulatory Visit (INDEPENDENT_AMBULATORY_CARE_PROVIDER_SITE_OTHER): Payer: BC Managed Care – PPO

## 2011-11-21 DIAGNOSIS — R1310 Dysphagia, unspecified: Secondary | ICD-10-CM

## 2011-11-21 DIAGNOSIS — I10 Essential (primary) hypertension: Secondary | ICD-10-CM

## 2011-11-21 DIAGNOSIS — E049 Nontoxic goiter, unspecified: Secondary | ICD-10-CM

## 2011-11-21 DIAGNOSIS — Z0000 Encounter for general adult medical examination without abnormal findings: Secondary | ICD-10-CM

## 2011-11-21 LAB — CBC WITH DIFFERENTIAL/PLATELET
Basophils Absolute: 0 10*3/uL (ref 0.0–0.1)
Basophils Relative: 0.5 % (ref 0.0–3.0)
Eosinophils Absolute: 0.6 10*3/uL (ref 0.0–0.7)
Eosinophils Relative: 7.2 % — ABNORMAL HIGH (ref 0.0–5.0)
HCT: 34 % — ABNORMAL LOW (ref 36.0–46.0)
Hemoglobin: 11.3 g/dL — ABNORMAL LOW (ref 12.0–15.0)
Lymphocytes Relative: 28.7 % (ref 12.0–46.0)
Lymphs Abs: 2.5 10*3/uL (ref 0.7–4.0)
MCHC: 33.1 g/dL (ref 30.0–36.0)
MCV: 86.7 fl (ref 78.0–100.0)
Monocytes Absolute: 0.5 10*3/uL (ref 0.1–1.0)
Monocytes Relative: 6.4 % (ref 3.0–12.0)
Neutro Abs: 4.9 10*3/uL (ref 1.4–7.7)
Neutrophils Relative %: 57.2 % (ref 43.0–77.0)
Platelets: 259 10*3/uL (ref 150.0–400.0)
RBC: 3.92 Mil/uL (ref 3.87–5.11)
RDW: 14.7 % — ABNORMAL HIGH (ref 11.5–14.6)
WBC: 8.6 10*3/uL (ref 4.5–10.5)

## 2011-11-21 LAB — LIPID PANEL
Cholesterol: 104 mg/dL (ref 0–200)
HDL: 48 mg/dL (ref >39.00)
LDL Cholesterol: 39 mg/dL (ref 0–99)
Total CHOL/HDL Ratio: 2
Triglycerides: 85 mg/dL (ref 0.0–149.0)
VLDL: 17 mg/dL (ref 0.0–40.0)

## 2011-11-21 LAB — BASIC METABOLIC PANEL
BUN: 13 mg/dL (ref 6–23)
CO2: 26 mEq/L (ref 19–32)
Calcium: 9 mg/dL (ref 8.4–10.5)
Chloride: 105 mEq/L (ref 96–112)
Creatinine, Ser: 1 mg/dL (ref 0.4–1.2)
GFR: 74.66 mL/min (ref >60.00)
Glucose, Bld: 109 mg/dL — ABNORMAL HIGH (ref 70–99)
Potassium: 4.1 mEq/L (ref 3.5–5.1)
Sodium: 139 mEq/L (ref 135–145)

## 2011-11-21 LAB — HEPATIC FUNCTION PANEL
ALT: 22 U/L (ref 0–35)
Alkaline Phosphatase: 71 U/L (ref 39–117)
Bilirubin, Direct: 0.1 mg/dL (ref 0.0–0.3)
Total Bilirubin: 0.4 mg/dL (ref 0.3–1.2)

## 2011-11-21 LAB — URINALYSIS
Nitrite: NEGATIVE
Total Protein, Urine: NEGATIVE
pH: 7 (ref 5.0–8.0)

## 2011-11-21 NOTE — Telephone Encounter (Signed)
Molly Newman, please, inform patient that all labs are normal except for a little elevated sugar and a very mild anemia Keep ROV  Please, mail the labs to the patient.    Thx

## 2011-11-21 NOTE — Telephone Encounter (Signed)
Pt informed/ copies mailed.  

## 2011-12-03 ENCOUNTER — Ambulatory Visit
Admission: RE | Admit: 2011-12-03 | Discharge: 2011-12-03 | Disposition: A | Discharge status: Home / Self Care | Payer: BC Managed Care – PPO | Source: Ambulatory Visit | Attending: Internal Medicine | Admitting: Internal Medicine

## 2011-12-04 ENCOUNTER — Telehealth: Payer: Self-pay | Admitting: Internal Medicine

## 2011-12-04 DIAGNOSIS — E049 Nontoxic goiter, unspecified: Secondary | ICD-10-CM

## 2011-12-04 NOTE — Telephone Encounter (Signed)
Left mess for patient to call back.  

## 2011-12-04 NOTE — Telephone Encounter (Signed)
Molly Newman, please, inform patient that she has thyroid gland cysts. We need to repeat US in 12 mo Thx

## 2011-12-05 NOTE — Telephone Encounter (Signed)
Called the patient informed of results per MD.  The patient would like more details and if medication can be offered.  Please advise

## 2011-12-06 NOTE — Telephone Encounter (Signed)
LMOM TO CALL FOR AN APPT WITH DR Everardo All

## 2011-12-06 NOTE — Telephone Encounter (Signed)
Pt called back wanting more information.  She wants to be referred to an Endocrinologist.

## 2011-12-06 NOTE — Telephone Encounter (Signed)
Done. Thx.

## 2011-12-07 NOTE — Telephone Encounter (Signed)
APPT WITH DR Adair Patter ON AUG 22

## 2011-12-21 ENCOUNTER — Encounter: Payer: Self-pay | Admitting: Gastroenterology

## 2011-12-27 ENCOUNTER — Encounter: Payer: Self-pay | Admitting: Endocrinology

## 2011-12-27 ENCOUNTER — Ambulatory Visit (INDEPENDENT_AMBULATORY_CARE_PROVIDER_SITE_OTHER): Payer: BC Managed Care – PPO | Admitting: Endocrinology

## 2011-12-27 VITALS — BP 130/80 | HR 59 | Temp 98.1°F | Ht 68.0 in | Wt 226.0 lb

## 2011-12-27 DIAGNOSIS — E049 Nontoxic goiter, unspecified: Secondary | ICD-10-CM

## 2011-12-27 NOTE — Patient Instructions (Addendum)
You should have the thyroid ultrasound rechecked next summer. Please call or come back if there is anything else i can do for you.

## 2011-12-27 NOTE — Progress Notes (Signed)
Subjective:    Patient ID: Molly Newman, female    DOB: [birth year], 61 y.o.   MRN: [id number]  HPI Pt was incidentally noted on physical exam to have a goiter.  Pt says she does not notice any swelling at the anterior neck, or associated pain.   Past Medical History  Diagnosis Date  . Allergy   . Hypertension    No past surgical history on file.  History   Social History  . Marital Status: Single    Spouse Name: N/A    Number of Children: N/A  . Years of Education: N/A   Occupational History  . Not on file.   Social History Main Topics  . Smoking status: Never Smoker   . Smokeless tobacco: Not on file  . Alcohol Use:   . Drug Use:   . Sexually Active:    Other Topics Concern  . Not on file   Social History Narrative  . No narrative on file    Current Outpatient Prescriptions on File Prior to Visit  Medication Sig Dispense Refill  . amLODipine (NORVASC) 10 MG tablet Take 1 tablet (10 mg total) by mouth daily.  30 tablet  11  . carvedilol (COREG) 25 MG tablet Take 1 tablet (25 mg total) by mouth 2 (two) times daily with a meal.  60 tablet  11  . Cholecalciferol (VITAMIN D3) 1000 UNITS tablet Take 1,000 Units by mouth daily.        . fexofenadine-pseudoephedrine (ALLEGRA-D 24) 180-240 MG per 24 hr tablet Take 1 tablet by mouth daily.        Marland Kitchen loratadine (CLARITIN) 10 MG tablet Take 10 mg by mouth daily.        Marland Kitchen omeprazole (PRILOSEC) 40 MG capsule Take 1 capsule (40 mg total) by mouth daily.  30 capsule  11  . phentermine 37.5 MG capsule Take 37.5 mg by mouth every morning.        . Guaifenesin-Codeine 300-10 MG/5ML LIQD 5-10 ml po qid prn cough  300 mL  0    Allergies  Allergen Reactions  . Ace Inhibitors   . Diltiazem Hcl     REACTION: Hair Loss    Family History  Problem Relation Age of Onset  . Hypertension Other   mother has an uncertain type of thyroid problem  BP 130/80  Pulse 59  Temp 98.1 F (36.7 C) (Oral)  Ht 5\' 8"  (1.727 m)  Wt 226 lb  (102.513 kg)  BMI 34.36 kg/m2  SpO2 99%    Review of Systems Heartburn is well-controlled.  denies depression, hair loss, cramps, sob, weight gain, memory loss, constipation, numbness, blurry vision, myalgias, dry skin, rhinorrhea, easy bruising, and syncope.      Objective:   Physical Exam VS: see vs page GEN: no distress HEAD: head: no deformity eyes: no periorbital swelling, no proptosis external nose and ears are normal mouth: no lesion seen NECK: supple, thyroid is not enlarged CHEST WALL: no deformity LUNGS:  Clear to auscultation CV: reg rate and rhythm, no murmur ABD: abdomen is soft, nontender.  no hepatosplenomegaly.  not distended.  no hernia MUSCULOSKELETAL: muscle bulk and strength are grossly normal.  no obvious joint swelling.  gait is normal and steady EXTEMITIES: no deformity.  no ulcer on the feet.  feet are of normal color and temp.  no edema PULSES: dorsalis pedis intact bilat.  no carotid bruit NEURO:  cn 2-12 grossly intact.   readily moves all 4's.  sensation  is intact to touch on the feet SKIN:  Normal texture and temperature.  No rash or suspicious lesion is visible.   NODES:  None palpable at the neck PSYCH: alert, oriented x3.  Does not appear anxious nor depressed.  Lab Results  Component Value Date   TSH 2.86 11/21/2011   (i reviewed thyroid US result and image with patient)    Assessment & Plan:  Multinodular goiter, small; usually hereditary.  She is euthyroid. Weight gain, not thyroid-related Cough, not thyroid-related

## 2012-02-25 ENCOUNTER — Ambulatory Visit: Payer: BC Managed Care – PPO | Admitting: Internal Medicine

## 2012-12-02 ENCOUNTER — Other Ambulatory Visit: Payer: Self-pay | Admitting: *Deleted

## 2012-12-02 MED ORDER — AMLODIPINE BESYLATE 10 MG PO TABS: 10.0000 mg | ORAL_TABLET | Freq: Every day | ORAL | Status: DC

## 2012-12-02 MED ORDER — CARVEDILOL 25 MG PO TABS: 25.0000 mg | ORAL_TABLET | Freq: Two times a day (BID) | ORAL | Status: DC

## 2012-12-02 NOTE — Telephone Encounter (Signed)
rx filled, OV 8.1.14.

## 2012-12-05 ENCOUNTER — Other Ambulatory Visit (INDEPENDENT_AMBULATORY_CARE_PROVIDER_SITE_OTHER): Payer: BC Managed Care – PPO

## 2012-12-05 ENCOUNTER — Ambulatory Visit (INDEPENDENT_AMBULATORY_CARE_PROVIDER_SITE_OTHER): Payer: BC Managed Care – PPO | Admitting: Internal Medicine

## 2012-12-05 ENCOUNTER — Encounter: Payer: Self-pay | Admitting: Internal Medicine

## 2012-12-05 VITALS — BP 120/84 | HR 72 | Temp 97.7°F | Resp 16 | Wt 226.0 lb

## 2012-12-05 DIAGNOSIS — R7309 Other abnormal glucose: Secondary | ICD-10-CM

## 2012-12-05 DIAGNOSIS — I10 Essential (primary) hypertension: Secondary | ICD-10-CM

## 2012-12-05 DIAGNOSIS — Z0000 Encounter for general adult medical examination without abnormal findings: Secondary | ICD-10-CM

## 2012-12-05 DIAGNOSIS — J209 Acute bronchitis, unspecified: Secondary | ICD-10-CM

## 2012-12-05 DIAGNOSIS — E049 Nontoxic goiter, unspecified: Secondary | ICD-10-CM

## 2012-12-05 DIAGNOSIS — R635 Abnormal weight gain: Secondary | ICD-10-CM

## 2012-12-05 LAB — LIPID PANEL
LDL Cholesterol: 38 mg/dL (ref 0–99)
Total CHOL/HDL Ratio: 2
Triglycerides: 58 mg/dL (ref 0.0–149.0)
VLDL: 11.6 mg/dL (ref 0.0–40.0)

## 2012-12-05 LAB — CBC WITH DIFFERENTIAL/PLATELET
Basophils Absolute: 0.1 10*3/uL (ref 0.0–0.1)
Basophils Relative: 0.6 % (ref 0.0–3.0)
Hemoglobin: 11.3 g/dL — ABNORMAL LOW (ref 12.0–15.0)
Lymphocytes Relative: 35.3 % (ref 12.0–46.0)
Monocytes Relative: 6.8 % (ref 3.0–12.0)
Neutro Abs: 4.7 10*3/uL (ref 1.4–7.7)
Neutrophils Relative %: 51.5 % (ref 43.0–77.0)
RBC: 3.9 Mil/uL (ref 3.87–5.11)

## 2012-12-05 LAB — URINALYSIS
Hgb urine dipstick: NEGATIVE
Ketones, ur: NEGATIVE
Total Protein, Urine: NEGATIVE
Urine Glucose: NEGATIVE

## 2012-12-05 LAB — BASIC METABOLIC PANEL
BUN: 16 mg/dL (ref 6–23)
CO2: 27 mEq/L (ref 19–32)
Chloride: 106 mEq/L (ref 96–112)
Creatinine, Ser: 1.1 mg/dL (ref 0.4–1.2)
Potassium: 4.2 mEq/L (ref 3.5–5.1)

## 2012-12-05 LAB — HEPATIC FUNCTION PANEL
Albumin: 3.6 g/dL (ref 3.5–5.2)
Alkaline Phosphatase: 68 U/L (ref 39–117)

## 2012-12-05 LAB — HEMOGLOBIN A1C: Hgb A1c MFr Bld: 6.4 % (ref 4.6–6.5)

## 2012-12-05 MED ORDER — AMLODIPINE BESYLATE 10 MG PO TABS: 10.0000 mg | ORAL_TABLET | Freq: Every day | ORAL | Status: DC

## 2012-12-05 MED ORDER — OMEPRAZOLE 40 MG PO CPDR: 40.0000 mg | DELAYED_RELEASE_CAPSULE | Freq: Every day | ORAL | Status: DC

## 2012-12-05 MED ORDER — CARVEDILOL 25 MG PO TABS: 25.0000 mg | ORAL_TABLET | Freq: Two times a day (BID) | ORAL | Status: DC

## 2012-12-05 NOTE — Assessment & Plan Note (Signed)
Continue with current diet  

## 2012-12-05 NOTE — Assessment & Plan Note (Signed)
Continue with current prescription therapy as reflected on the Med list.  

## 2012-12-05 NOTE — Assessment & Plan Note (Signed)
Gluten free trial (no wheat products) x4-6 weeks. OK to use Gluten-free bread and pasta. Milk free trial (no milk, ice cream and yogurt) x4 weeks. OK to use almond or soy milk. Continue with current prescription therapy as reflected on the Med list.  

## 2012-12-05 NOTE — Assessment & Plan Note (Signed)
Labs

## 2012-12-05 NOTE — Assessment & Plan Note (Addendum)
Watching - will repeat US

## 2012-12-05 NOTE — Progress Notes (Signed)
Subjective:     HPI  F/u a goiter.  Pt says she does not notice any swelling at the anterior neck, or associated pain.    Wt Readings from Last 3 Encounters:  12/05/12 226 lb (102.513 kg)  12/27/11 226 lb (102.513 kg)  11/16/11 231 lb (104.781 kg)   BP Readings from Last 3 Encounters:  12/05/12 120/84  12/27/11 130/80  11/16/11 124/80      Past Medical History  Diagnosis Date  . Allergy   . Hypertension    History reviewed. No pertinent past surgical history.  History   Social History  . Marital Status: Single    Spouse Name: N/A    Number of Children: N/A  . Years of Education: N/A   Occupational History  . Not on file.   Social History Main Topics  . Smoking status: Never Smoker   . Smokeless tobacco: Not on file  . Alcohol Use:   . Drug Use:   . Sexually Active:    Other Topics Concern  . Not on file   Social History Narrative  . No narrative on file    Current Outpatient Prescriptions on File Prior to Visit  Medication Sig Dispense Refill  . amLODipine (NORVASC) 10 MG tablet Take 1 tablet (10 mg total) by mouth daily.  30 tablet  0  . carvedilol (COREG) 25 MG tablet Take 1 tablet (25 mg total) by mouth 2 (two) times daily with a meal.  60 tablet  0  . Cholecalciferol (VITAMIN D3) 1000 UNITS tablet Take 1,000 Units by mouth daily.        . fexofenadine-pseudoephedrine (ALLEGRA-D 24) 180-240 MG per 24 hr tablet Take 1 tablet by mouth daily.        . Guaifenesin-Codeine 300-10 MG/5ML LIQD 5-10 ml po qid prn cough  300 mL  0  . loratadine (CLARITIN) 10 MG tablet Take 10 mg by mouth daily.        Marland Kitchen omeprazole (PRILOSEC) 40 MG capsule Take 1 capsule (40 mg total) by mouth daily.  30 capsule  11  . phentermine 37.5 MG capsule Take 37.5 mg by mouth every morning.         No current facility-administered medications on file prior to visit.    Allergies  Allergen Reactions  . Ace Inhibitors   . Diltiazem Hcl     REACTION: Hair Loss    Family  History  Problem Relation Age of Onset  . Hypertension Other   mother has an uncertain type of thyroid problem  BP 120/84  Pulse 72  Temp(Src) 97.7 F (36.5 C) (Oral)  Resp 16  Wt 226 lb (102.513 kg)  BMI 34.37 kg/m2    Review of Systems Heartburn is well-controlled.  denies depression, hair loss, cramps, sob, weight gain, memory loss, constipation, numbness, blurry vision, myalgias, dry skin, rhinorrhea, easy bruising, and syncope.      Objective:   Physical Exam VS: see vs page GEN: no distress HEAD: head: no deformity eyes: no periorbital swelling, no proptosis external nose and ears are normal mouth: no lesion seen NECK: supple, thyroid is not enlarged CHEST WALL: no deformity LUNGS:  Clear to auscultation CV: reg rate and rhythm, no murmur ABD: abdomen is soft, nontender.  no hepatosplenomegaly.  not distended.  no hernia MUSCULOSKELETAL: muscle bulk and strength are grossly normal.  no obvious joint swelling.  gait is normal and steady EXTEMITIES: no deformity.  no ulcer on the feet.  feet are of  normal color and temp.  no edema PULSES: dorsalis pedis intact bilat.  no carotid bruit NEURO:  cn 2-12 grossly intact.   readily moves all 4's.  sensation is intact to touch on the feet SKIN:  Normal texture and temperature.  No rash or suspicious lesion is visible.   NODES:  None palpable at the neck PSYCH: alert, oriented x3.  Does not appear anxious nor depressed.  Lab Results  Component Value Date   TSH 2.86 11/21/2011       Assessment & Plan:

## 2012-12-05 NOTE — Patient Instructions (Addendum)
Gluten free trial (no wheat products) x4-6 weeks. OK to use Gluten-free bread and pasta. Milk free trial (no milk, ice cream and yogurt) x4 weeks. OK to use almond or soy milk.  BP Readings from Last 3 Encounters:  12/05/12 120/84  12/27/11 130/80  11/16/11 124/80   Wt Readings from Last 3 Encounters:  12/05/12 226 lb (102.513 kg)  12/27/11 226 lb (102.513 kg)  11/16/11 231 lb (104.781 kg)

## 2012-12-10 ENCOUNTER — Telehealth: Payer: Self-pay | Admitting: *Deleted

## 2012-12-10 NOTE — Telephone Encounter (Signed)
12/05/12 labs mailed to pt per her request.

## 2013-08-05 LAB — HM PAP SMEAR

## 2013-12-29 ENCOUNTER — Encounter: Payer: Self-pay | Admitting: Internal Medicine

## 2013-12-29 ENCOUNTER — Ambulatory Visit (INDEPENDENT_AMBULATORY_CARE_PROVIDER_SITE_OTHER): Payer: BC Managed Care – PPO | Admitting: Internal Medicine

## 2013-12-29 VITALS — BP 120/80 | HR 60 | Temp 98.2°F | Ht 68.0 in | Wt 214.0 lb

## 2013-12-29 DIAGNOSIS — R7309 Other abnormal glucose: Secondary | ICD-10-CM

## 2013-12-29 DIAGNOSIS — E049 Nontoxic goiter, unspecified: Secondary | ICD-10-CM

## 2013-12-29 DIAGNOSIS — Z0000 Encounter for general adult medical examination without abnormal findings: Secondary | ICD-10-CM

## 2013-12-29 DIAGNOSIS — R635 Abnormal weight gain: Secondary | ICD-10-CM

## 2013-12-29 DIAGNOSIS — I10 Essential (primary) hypertension: Secondary | ICD-10-CM

## 2013-12-29 MED ORDER — AMLODIPINE BESYLATE 10 MG PO TABS: 10.0000 mg | ORAL_TABLET | Freq: Every day | ORAL | Status: DC

## 2013-12-29 MED ORDER — CARVEDILOL 25 MG PO TABS
25.0000 mg | ORAL_TABLET | Freq: Two times a day (BID) | ORAL | Status: DC
Start: 2013-12-29 — End: 2015-01-03

## 2013-12-29 MED ORDER — OMEPRAZOLE 40 MG PO CPDR: 40.0000 mg | DELAYED_RELEASE_CAPSULE | Freq: Every day | ORAL | Status: DC

## 2013-12-29 NOTE — Assessment & Plan Note (Signed)
Labs

## 2013-12-29 NOTE — Assessment & Plan Note (Addendum)
Labs  Pt missed [place] last year - her mom was sick. Will sch [place]

## 2013-12-29 NOTE — Assessment & Plan Note (Signed)
Wt Readings from Last 3 Encounters:  12/29/13 214 lb (97.07 kg)  12/05/12 226 lb (102.513 kg)  12/27/11 226 lb (102.513 kg)  Lost wt

## 2013-12-29 NOTE — Assessment & Plan Note (Signed)
Continue with current prescription therapy as reflected on the Med list.  

## 2013-12-29 NOTE — Progress Notes (Signed)
Subjective:     HPI   The patient is here for a wellness exam. The patient has been doing well overall without major physical or psychological issues going on lately.  F/u a goiter.  Pt says she does not notice any swelling at the anterior neck, or associated pain.    Wt Readings from Last 3 Encounters:  12/29/13 214 lb (97.07 kg)  12/05/12 226 lb (102.513 kg)  12/27/11 226 lb (102.513 kg)   BP Readings from Last 3 Encounters:  12/29/13 120/80  12/05/12 120/84  12/27/11 130/80      Past Medical History  Diagnosis Date  . Allergy   . Hypertension    History reviewed. No pertinent past surgical history.  History   Social History  . Marital Status: Single    Spouse Name: N/A    Number of Children: N/A  . Years of Education: N/A   Occupational History  . Not on file.   Social History Main Topics  . Smoking status: Never Smoker   . Smokeless tobacco: Not on file  . Alcohol Use: No  . Drug Use: No  . Sexual Activity: Not on file   Other Topics Concern  . Not on file   Social History Narrative  . No narrative on file    Current Outpatient Prescriptions on File Prior to Visit  Medication Sig Dispense Refill  . amLODipine (NORVASC) 10 MG tablet Take 1 tablet (10 mg total) by mouth daily.  30 tablet  11  . carvedilol (COREG) 25 MG tablet Take 1 tablet (25 mg total) by mouth 2 (two) times daily with a meal.  60 tablet  11  . Cholecalciferol (VITAMIN D3) 1000 UNITS tablet Take 1,000 Units by mouth daily.        Marland Kitchen loratadine (CLARITIN) 10 MG tablet Take 10 mg by mouth daily.        . fexofenadine-pseudoephedrine (ALLEGRA-D 24) 180-240 MG per 24 hr tablet Take 1 tablet by mouth daily.        . Guaifenesin-Codeine 300-10 MG/5ML LIQD 5-10 ml po qid prn cough  300 mL  0  . omeprazole (PRILOSEC) 40 MG capsule Take 1 capsule (40 mg total) by mouth daily.  30 capsule  11  . phentermine 37.5 MG capsule Take 37.5 mg by mouth every morning.         No current  facility-administered medications on file prior to visit.    Allergies  Allergen Reactions  . Ace Inhibitors   . Diltiazem Hcl     REACTION: Hair Loss    Family History  Problem Relation Age of Onset  . Hypertension Other   mother has an uncertain type of thyroid problem  BP 120/80  Pulse 60  Temp(Src) 98.2 F (36.8 C) (Oral)  Ht 5\' 8"  (1.727 m)  Wt 214 lb (97.07 kg)  BMI 32.55 kg/m2  SpO2 97%    Review of Systems Heartburn is well-controlled.  denies depression, hair loss, cramps, sob, weight gain, memory loss, constipation, numbness, blurry vision, myalgias, dry skin, rhinorrhea, easy bruising, and syncope.      Objective:   Physical Exam VS: see vs page GEN: no distress HEAD: head: no deformity eyes: no periorbital swelling, no proptosis external nose and ears are normal mouth: no lesion seen NECK: supple, thyroid is not enlarged CHEST WALL: no deformity LUNGS:  Clear to auscultation CV: reg rate and rhythm, no murmur ABD: abdomen is soft, nontender.  no hepatosplenomegaly.  not distended.  no hernia MUSCULOSKELETAL: muscle bulk and strength are grossly normal.  no obvious joint swelling.  gait is normal and steady EXTEMITIES: no deformity.  no ulcer on the feet.  feet are of normal color and temp.  no edema PULSES: dorsalis pedis intact bilat.  no carotid bruit NEURO:  cn 2-12 grossly intact.   readily moves all 4's.  sensation is intact to touch on the feet SKIN:  Normal texture and temperature.  No rash or suspicious lesion is visible.   NODES:  None palpable at the neck PSYCH: alert, oriented x3.  Does not appear anxious nor depressed.  Lab Results  Component Value Date   TSH 3.52 12/05/2012       Assessment & Plan:

## 2013-12-29 NOTE — Assessment & Plan Note (Addendum)
We discussed age appropriate health related issues, including available/recomended screening tests and vaccinations. We discussed a need for adhering to healthy diet and exercise. Labs/EKG were reviewed/ordered. All questions were answered.  Cologuard Labs GYN q 12 mo

## 2013-12-29 NOTE — Progress Notes (Signed)
Pre visit review using our clinic review tool, if applicable. No additional management support is needed unless otherwise documented below in the visit note. 

## 2013-12-30 ENCOUNTER — Other Ambulatory Visit (INDEPENDENT_AMBULATORY_CARE_PROVIDER_SITE_OTHER): Payer: BC Managed Care – PPO

## 2013-12-30 ENCOUNTER — Telehealth: Payer: Self-pay | Admitting: Internal Medicine

## 2013-12-30 DIAGNOSIS — Z0000 Encounter for general adult medical examination without abnormal findings: Secondary | ICD-10-CM

## 2013-12-30 LAB — CBC WITH DIFFERENTIAL/PLATELET
BASOS PCT: 0.4 % (ref 0.0–3.0)
Basophils Absolute: 0 10*3/uL (ref 0.0–0.1)
Eosinophils Absolute: 0.4 10*3/uL (ref 0.0–0.7)
Eosinophils Relative: 5.2 % — ABNORMAL HIGH (ref 0.0–5.0)
HCT: 34.8 % — ABNORMAL LOW (ref 36.0–46.0)
HEMOGLOBIN: 11.5 g/dL — AB (ref 12.0–15.0)
LYMPHS PCT: 31.8 % (ref 12.0–46.0)
Lymphs Abs: 2.4 10*3/uL (ref 0.7–4.0)
MCHC: 33 g/dL (ref 30.0–36.0)
MCV: 87.4 fl (ref 78.0–100.0)
MONOS PCT: 6.5 % (ref 3.0–12.0)
Monocytes Absolute: 0.5 10*3/uL (ref 0.1–1.0)
NEUTROS PCT: 56.1 % (ref 43.0–77.0)
Neutro Abs: 4.3 10*3/uL (ref 1.4–7.7)
Platelets: 273 10*3/uL (ref 150.0–400.0)
RBC: 3.98 Mil/uL (ref 3.87–5.11)
RDW: 14.7 % (ref 11.5–15.5)
WBC: 7.6 10*3/uL (ref 4.0–10.5)

## 2013-12-30 LAB — HEPATIC FUNCTION PANEL
ALK PHOS: 70 U/L (ref 39–117)
ALT: 19 U/L (ref 0–35)
AST: 20 U/L (ref 0–37)
Albumin: 3.6 g/dL (ref 3.5–5.2)
Bilirubin, Direct: 0.1 mg/dL (ref 0.0–0.3)
TOTAL PROTEIN: 7.7 g/dL (ref 6.0–8.3)
Total Bilirubin: 0.4 mg/dL (ref 0.2–1.2)

## 2013-12-30 LAB — URINALYSIS
Bilirubin Urine: NEGATIVE
HGB URINE DIPSTICK: NEGATIVE
Ketones, ur: NEGATIVE
Leukocytes, UA: NEGATIVE
NITRITE: NEGATIVE
SPECIFIC GRAVITY, URINE: 1.01 (ref 1.000–1.030)
Total Protein, Urine: NEGATIVE
UROBILINOGEN UA: 0.2 (ref 0.0–1.0)
Urine Glucose: NEGATIVE
pH: 7 (ref 5.0–8.0)

## 2013-12-30 LAB — BASIC METABOLIC PANEL
BUN: 13 mg/dL (ref 6–23)
CO2: 23 mEq/L (ref 19–32)
CREATININE: 1.1 mg/dL (ref 0.4–1.2)
Calcium: 9.1 mg/dL (ref 8.4–10.5)
Chloride: 106 mEq/L (ref 96–112)
GFR: 62.26 mL/min (ref >60.00)
Glucose, Bld: 98 mg/dL (ref 70–99)
Potassium: 4 mEq/L (ref 3.5–5.1)
SODIUM: 138 meq/L (ref 135–145)

## 2013-12-30 LAB — LIPID PANEL
CHOL/HDL RATIO: 2
Cholesterol: 96 mg/dL (ref 0–200)
HDL: 44.6 mg/dL (ref >39.00)
LDL Cholesterol: 32 mg/dL (ref 0–99)
NONHDL: 51.4
Triglycerides: 95 mg/dL (ref 0.0–149.0)
VLDL: 19 mg/dL (ref 0.0–40.0)

## 2013-12-30 LAB — TSH: TSH: 2.14 u[IU]/mL (ref 0.35–4.50)

## 2013-12-30 NOTE — Telephone Encounter (Signed)
Relevant patient education mailed to patient.  

## 2013-12-31 NOTE — Progress Notes (Signed)
Left message for patient to call me back to inform her of normal lab results.

## 2014-01-13 ENCOUNTER — Other Ambulatory Visit: Payer: BC Managed Care – PPO

## 2014-01-20 ENCOUNTER — Ambulatory Visit
Admission: RE | Admit: 2014-01-20 | Discharge: 2014-01-20 | Disposition: A | Discharge status: Home / Self Care | Payer: BC Managed Care – PPO | Source: Ambulatory Visit | Attending: Internal Medicine | Admitting: Internal Medicine

## 2014-07-14 LAB — HM MAMMOGRAPHY

## 2014-10-21 ENCOUNTER — Telehealth: Payer: Self-pay | Admitting: *Deleted

## 2014-10-21 NOTE — Telephone Encounter (Signed)
I called pt to see when her last mammogram was. She states she had it last in 07/2014 with Dr. Harrington Challenger at [place]. Will call his office for report @ [phone].

## 2014-12-03 ENCOUNTER — Encounter: Payer: Self-pay | Admitting: *Deleted

## 2015-01-03 ENCOUNTER — Encounter: Payer: Self-pay | Admitting: Internal Medicine

## 2015-01-03 ENCOUNTER — Ambulatory Visit (INDEPENDENT_AMBULATORY_CARE_PROVIDER_SITE_OTHER): Payer: BLUE CROSS/BLUE SHIELD | Admitting: Internal Medicine

## 2015-01-03 VITALS — BP 128/86 | HR 54 | Temp 97.5°F | Ht 68.0 in | Wt 214.0 lb

## 2015-01-03 DIAGNOSIS — Z23 Encounter for immunization: Secondary | ICD-10-CM

## 2015-01-03 DIAGNOSIS — Z0000 Encounter for general adult medical examination without abnormal findings: Secondary | ICD-10-CM

## 2015-01-03 DIAGNOSIS — I10 Essential (primary) hypertension: Secondary | ICD-10-CM

## 2015-01-03 MED ORDER — CARVEDILOL 25 MG PO TABS: 25.0000 mg | ORAL_TABLET | Freq: Two times a day (BID) | ORAL | Status: DC

## 2015-01-03 MED ORDER — OMEPRAZOLE 40 MG PO CPDR: 40.0000 mg | DELAYED_RELEASE_CAPSULE | Freq: Every day | ORAL | Status: DC

## 2015-01-03 MED ORDER — AMLODIPINE BESYLATE 10 MG PO TABS: 10.0000 mg | ORAL_TABLET | Freq: Every day | ORAL | Status: DC

## 2015-01-03 NOTE — Patient Instructions (Signed)
Zostavax for shingles 

## 2015-01-03 NOTE — Assessment & Plan Note (Deleted)
Doing well 

## 2015-01-03 NOTE — Assessment & Plan Note (Signed)
8/16 On Amlodipine, Coreg

## 2015-01-03 NOTE — Progress Notes (Signed)
Subjective:  Patient ID: Molly Newman, female    DOB: Jun 12, 1950  Age: 64 y.o. MRN: [id number]  CC: No chief complaint on file.   HPI Molly Newman presents for a well exam  Outpatient Prescriptions Prior to Visit  Medication Sig Dispense Refill  . Cholecalciferol (VITAMIN D3) 1000 UNITS tablet Take 1,000 Units by mouth daily.      . fexofenadine-pseudoephedrine (ALLEGRA-D 24) 180-240 MG per 24 hr tablet Take 1 tablet by mouth daily.      . Guaifenesin-Codeine 300-10 MG/5ML LIQD 5-10 ml po qid prn cough 300 mL 0  . loratadine (CLARITIN) 10 MG tablet Take 10 mg by mouth daily.      Marland Kitchen amLODipine (NORVASC) 10 MG tablet Take 1 tablet (10 mg total) by mouth daily. 30 tablet 11  . carvedilol (COREG) 25 MG tablet Take 1 tablet (25 mg total) by mouth 2 (two) times daily with a meal. 60 tablet 11  . omeprazole (PRILOSEC) 40 MG capsule Take 1 capsule (40 mg total) by mouth daily. 30 capsule 11   No facility-administered medications prior to visit.    ROS  Review of Systems  Constitutional: Negative for chills, activity change, appetite change, fatigue and unexpected weight change.  HENT: Negative for congestion, mouth sores and sinus pressure.   Eyes: Negative for visual disturbance.  Respiratory: Negative for cough and chest tightness.   Gastrointestinal: Negative for nausea and abdominal pain.  Genitourinary: Negative for frequency, difficulty urinating and vaginal pain.  Musculoskeletal: Negative for back pain and gait problem.  Skin: Negative for pallor and rash.  Neurological: Negative for dizziness, tremors, weakness, numbness and headaches.  Psychiatric/Behavioral: Negative for confusion and sleep disturbance.    Objective:  BP 128/86 mmHg  Pulse 54  Temp(Src) 97.5 F (36.4 C) (Oral)  Ht 5\' 8"  (1.727 m)  Wt 214 lb (97.07 kg)  BMI 32.55 kg/m2  SpO2 96%  BP Readings from Last 3 Encounters:  01/03/15 128/86  12/29/13 120/80  12/05/12 120/84    Wt Readings  from Last 3 Encounters:  01/03/15 214 lb (97.07 kg)  12/29/13 214 lb (97.07 kg)  12/05/12 226 lb (102.513 kg)    Physical Exam  Constitutional: She appears well-developed. No distress.  HENT:  Head: Normocephalic.  Right Ear: External ear normal.  Left Ear: External ear normal.  Nose: Nose normal.  Mouth/Throat: Oropharynx is clear and moist.  Eyes: Conjunctivae are normal. Pupils are equal, round, and reactive to light. Right eye exhibits no discharge. Left eye exhibits no discharge.  Neck: Normal range of motion. Neck supple. No JVD present. No tracheal deviation present. No thyromegaly present.  Cardiovascular: Normal rate, regular rhythm and normal heart sounds.   Pulmonary/Chest: No stridor. No respiratory distress. She has no wheezes.  Abdominal: Soft. Bowel sounds are normal. She exhibits no distension and no mass. There is no tenderness. There is no rebound and no guarding.  Musculoskeletal: She exhibits no edema or tenderness.  Lymphadenopathy:    She has no cervical adenopathy.  Neurological: She displays normal reflexes. No cranial nerve deficit. She exhibits normal muscle tone. Coordination normal.  Skin: No rash noted. No erythema.  Psychiatric: She has a normal mood and affect. Her behavior is normal. Judgment and thought content normal.    Lab Results  Component Value Date   WBC 8.3 01/04/2015   HGB 11.5* 01/04/2015   HCT 34.7* 01/04/2015   PLT 255.0 01/04/2015   GLUCOSE 96 01/04/2015   CHOL 91 01/04/2015  TRIG 84.0 01/04/2015   HDL 42.20 01/04/2015   LDLCALC 32 01/04/2015   ALT 12 01/04/2015   AST 14 01/04/2015   NA 138 01/04/2015   K 4.1 01/04/2015   CL 104 01/04/2015   CREATININE 1.00 01/04/2015   BUN 16 01/04/2015   CO2 29 01/04/2015   TSH 2.14 01/04/2015   HGBA1C 6.4 12/05/2012   EKG S brady  US Soft Tissue Head/neck  01/20/2014   CLINICAL DATA:  goiter  EXAM: THYROID ULTRASOUND  TECHNIQUE: Ultrasound examination of the thyroid gland and  adjacent soft tissues was performed.  COMPARISON:  12/03/2011  FINDINGS: Right thyroid lobe  Measurements: 47 x 16 x 21 mm. Multiple small cystic lesions. Largest 4 x 4 x 6 mm, inferior pole. Remainder less than 5 mm.  Left thyroid lobe  Measurements: 51 x 17 x 21 mm. Multiple small cysts. Largest 13 x 6 x 10 mm, inferior pole (previously 12 x 6 x 9). Remainder less than 5 mm.  Isthmus  Thickness: 4 mm.  No nodules visualized.  Lymphadenopathy  None visualized. Bilateral cervical nodes less than 6 mm short axis diameter are noted.  IMPRESSION: 1. Thyromegaly with multiple small cysts. Findings do not meet current consensus criteria for biopsy. Follow-up by clinical exam is recommended. If patient has known risk factors for thyroid carcinoma, consider follow-up ultrasound in 12 months. If patient is clinically hyperthyroid, consider nuclear medicine thyroid uptake and scan. This recommendation follows the consensus statement: Management of Thyroid Nodules Detected as [place]: Society of Radiologists in [place]. Radiology 2005; [id number].   Electronically Signed   By: Arne Cleveland M.D.   On: 01/20/2014 11:37    Assessment & Plan:   Diagnoses and all orders for this visit:  Well adult exam -     CBC with Differential/Platelet; Future -     Basic metabolic panel; Future -     Hepatic function panel; Future -     Lipid panel; Future -     TSH; Future -     Urinalysis; Future -     EKG 12-Lead  Essential hypertension  Need for Tdap vaccination -     Cancel: Td vaccine greater than or equal to 7yo preservative free IM -     Tdap vaccine greater than or equal to 7yo IM  Other orders -     amLODipine (NORVASC) 10 MG tablet; Take 1 tablet (10 mg total) by mouth daily. -     carvedilol (COREG) 25 MG tablet; Take 1 tablet (25 mg total) by mouth 2 (two) times daily with a meal. -     omeprazole (PRILOSEC) 40 MG capsule; Take 1 capsule (40 mg total) by mouth daily.  I  am having [name] maintain her loratadine, cholecalciferol, fexofenadine-pseudoephedrine, Guaifenesin-Codeine, amLODipine, carvedilol, and omeprazole.  Meds ordered this encounter  Medications  . amLODipine (NORVASC) 10 MG tablet    Sig: Take 1 tablet (10 mg total) by mouth daily.    Dispense:  90 tablet    Refill:  3  . carvedilol (COREG) 25 MG tablet    Sig: Take 1 tablet (25 mg total) by mouth 2 (two) times daily with a meal.    Dispense:  180 tablet    Refill:  3  . omeprazole (PRILOSEC) 40 MG capsule    Sig: Take 1 capsule (40 mg total) by mouth daily.    Dispense:  30 capsule    Refill:  11   EKG NSR  Follow-up: Return in about 6 months (around 07/05/2015) for a follow-up visit.  Walker Kehr, MD

## 2015-01-03 NOTE — Progress Notes (Signed)
Pre visit review using our clinic review tool, if applicable. No additional management support is needed unless otherwise documented below in the visit note. 

## 2015-01-03 NOTE — Assessment & Plan Note (Signed)
We discussed age appropriate health related issues, including available/recomended screening tests and vaccinations. We discussed a need for adhering to healthy diet and exercise. Labs/EKG were reviewed/ordered. All questions were answered.   

## 2015-01-04 ENCOUNTER — Other Ambulatory Visit (INDEPENDENT_AMBULATORY_CARE_PROVIDER_SITE_OTHER): Payer: BLUE CROSS/BLUE SHIELD

## 2015-01-04 DIAGNOSIS — Z0000 Encounter for general adult medical examination without abnormal findings: Secondary | ICD-10-CM | POA: Diagnosis not present

## 2015-01-04 LAB — BASIC METABOLIC PANEL
BUN: 16 mg/dL (ref 6–23)
CHLORIDE: 104 meq/L (ref 96–112)
CO2: 29 meq/L (ref 19–32)
CREATININE: 1 mg/dL (ref 0.40–1.20)
Calcium: 9.2 mg/dL (ref 8.4–10.5)
GFR: 72.18 mL/min (ref >60.00)
Glucose, Bld: 96 mg/dL (ref 70–99)
POTASSIUM: 4.1 meq/L (ref 3.5–5.1)
SODIUM: 138 meq/L (ref 135–145)

## 2015-01-04 LAB — URINALYSIS
Bilirubin Urine: NEGATIVE
HGB URINE DIPSTICK: NEGATIVE
KETONES UR: NEGATIVE
Leukocytes, UA: NEGATIVE
Nitrite: NEGATIVE
Specific Gravity, Urine: <=1.005 — AB (ref 1.000–1.030)
TOTAL PROTEIN, URINE-UPE24: NEGATIVE
URINE GLUCOSE: NEGATIVE
UROBILINOGEN UA: 0.2 (ref 0.0–1.0)
pH: 6.5 (ref 5.0–8.0)

## 2015-01-04 LAB — CBC WITH DIFFERENTIAL/PLATELET
BASOS ABS: 0.1 10*3/uL (ref 0.0–0.1)
BASOS PCT: 1 % (ref 0.0–3.0)
EOS ABS: 0.4 10*3/uL (ref 0.0–0.7)
Eosinophils Relative: 4.8 % (ref 0.0–5.0)
HCT: 34.7 % — ABNORMAL LOW (ref 36.0–46.0)
HEMOGLOBIN: 11.5 g/dL — AB (ref 12.0–15.0)
Lymphocytes Relative: 31.9 % (ref 12.0–46.0)
Lymphs Abs: 2.6 10*3/uL (ref 0.7–4.0)
MCHC: 33.1 g/dL (ref 30.0–36.0)
MCV: 86.5 fl (ref 78.0–100.0)
MONO ABS: 0.5 10*3/uL (ref 0.1–1.0)
Monocytes Relative: 6.2 % (ref 3.0–12.0)
Neutro Abs: 4.7 10*3/uL (ref 1.4–7.7)
Neutrophils Relative %: 56.1 % (ref 43.0–77.0)
PLATELETS: 255 10*3/uL (ref 150.0–400.0)
RBC: 4.01 Mil/uL (ref 3.87–5.11)
RDW: 14.4 % (ref 11.5–15.5)
WBC: 8.3 10*3/uL (ref 4.0–10.5)

## 2015-01-04 LAB — LIPID PANEL
CHOL/HDL RATIO: 2
Cholesterol: 91 mg/dL (ref 0–200)
HDL: 42.2 mg/dL (ref >39.00)
LDL CALC: 32 mg/dL (ref 0–99)
NONHDL: 48.39
Triglycerides: 84 mg/dL (ref 0.0–149.0)
VLDL: 16.8 mg/dL (ref 0.0–40.0)

## 2015-01-04 LAB — HEPATIC FUNCTION PANEL
ALK PHOS: 75 U/L (ref 39–117)
ALT: 12 U/L (ref 0–35)
AST: 14 U/L (ref 0–37)
Albumin: 3.9 g/dL (ref 3.5–5.2)
BILIRUBIN DIRECT: 0.1 mg/dL (ref 0.0–0.3)
TOTAL PROTEIN: 7.4 g/dL (ref 6.0–8.3)
Total Bilirubin: 0.4 mg/dL (ref 0.2–1.2)

## 2015-01-04 LAB — TSH: TSH: 2.14 u[IU]/mL (ref 0.35–4.50)

## 2015-07-05 ENCOUNTER — Ambulatory Visit (INDEPENDENT_AMBULATORY_CARE_PROVIDER_SITE_OTHER): Payer: BLUE CROSS/BLUE SHIELD | Admitting: Internal Medicine

## 2015-07-05 ENCOUNTER — Other Ambulatory Visit (INDEPENDENT_AMBULATORY_CARE_PROVIDER_SITE_OTHER): Payer: BLUE CROSS/BLUE SHIELD

## 2015-07-05 ENCOUNTER — Encounter: Payer: Self-pay | Admitting: Internal Medicine

## 2015-07-05 VITALS — BP 120/70 | HR 53 | Wt 202.0 lb

## 2015-07-05 DIAGNOSIS — J069 Acute upper respiratory infection, unspecified: Secondary | ICD-10-CM

## 2015-07-05 DIAGNOSIS — I10 Essential (primary) hypertension: Secondary | ICD-10-CM

## 2015-07-05 DIAGNOSIS — R635 Abnormal weight gain: Secondary | ICD-10-CM

## 2015-07-05 DIAGNOSIS — Z23 Encounter for immunization: Secondary | ICD-10-CM | POA: Diagnosis not present

## 2015-07-05 DIAGNOSIS — R001 Bradycardia, unspecified: Secondary | ICD-10-CM

## 2015-07-05 DIAGNOSIS — E049 Nontoxic goiter, unspecified: Secondary | ICD-10-CM

## 2015-07-05 LAB — BASIC METABOLIC PANEL
BUN: 13 mg/dL (ref 6–23)
CALCIUM: 9.4 mg/dL (ref 8.4–10.5)
CHLORIDE: 104 meq/L (ref 96–112)
CO2: 29 meq/L (ref 19–32)
CREATININE: 0.96 mg/dL (ref 0.40–1.20)
GFR: 75.54 mL/min (ref >60.00)
GLUCOSE: 100 mg/dL — AB (ref 70–99)
Potassium: 4.2 mEq/L (ref 3.5–5.1)
SODIUM: 139 meq/L (ref 135–145)

## 2015-07-05 LAB — CBC WITH DIFFERENTIAL/PLATELET
BASOS PCT: 1 % (ref 0.0–3.0)
Basophils Absolute: 0.1 10*3/uL (ref 0.0–0.1)
EOS PCT: 7.2 % — AB (ref 0.0–5.0)
Eosinophils Absolute: 0.6 10*3/uL (ref 0.0–0.7)
HCT: 34.5 % — ABNORMAL LOW (ref 36.0–46.0)
Hemoglobin: 11.4 g/dL — ABNORMAL LOW (ref 12.0–15.0)
LYMPHS ABS: 3.1 10*3/uL (ref 0.7–4.0)
Lymphocytes Relative: 35.5 % (ref 12.0–46.0)
MCHC: 33.2 g/dL (ref 30.0–36.0)
MCV: 85.9 fl (ref 78.0–100.0)
MONO ABS: 0.5 10*3/uL (ref 0.1–1.0)
Monocytes Relative: 5.7 % (ref 3.0–12.0)
NEUTROS PCT: 50.6 % (ref 43.0–77.0)
Neutro Abs: 4.4 10*3/uL (ref 1.4–7.7)
PLATELETS: 288 10*3/uL (ref 150.0–400.0)
RBC: 4.01 Mil/uL (ref 3.87–5.11)
RDW: 15.6 % — AB (ref 11.5–15.5)
WBC: 8.8 10*3/uL (ref 4.0–10.5)

## 2015-07-05 LAB — TSH: TSH: 2.31 u[IU]/mL (ref 0.35–4.50)

## 2015-07-05 NOTE — Progress Notes (Signed)
Pre visit review using our clinic review tool, if applicable. No additional management support is needed unless otherwise documented below in the visit note. 

## 2015-07-05 NOTE — Assessment & Plan Note (Signed)
Amlodipine and Coreg Labs

## 2015-07-05 NOTE — Progress Notes (Signed)
Subjective:  Patient ID: Molly Newman, female    DOB: 04-02-1951  Age: 65 y.o. MRN: [id number]  CC: No chief complaint on file.   HPI Molly Newman presents for HTN, allergies, GERD f/u  Outpatient Prescriptions Prior to Visit  Medication Sig Dispense Refill  . amLODipine (NORVASC) 10 MG tablet Take 1 tablet (10 mg total) by mouth daily. 90 tablet 3  . carvedilol (COREG) 25 MG tablet Take 1 tablet (25 mg total) by mouth 2 (two) times daily with a meal. 180 tablet 3  . Cholecalciferol (VITAMIN D3) 1000 UNITS tablet Take 1,000 Units by mouth daily.      . fexofenadine-pseudoephedrine (ALLEGRA-D 24) 180-240 MG per 24 hr tablet Take 1 tablet by mouth daily.      . Guaifenesin-Codeine 300-10 MG/5ML LIQD 5-10 ml po qid prn cough 300 mL 0  . loratadine (CLARITIN) 10 MG tablet Take 10 mg by mouth daily.      Marland Kitchen omeprazole (PRILOSEC) 40 MG capsule Take 1 capsule (40 mg total) by mouth daily. 30 capsule 11   No facility-administered medications prior to visit.    ROS Review of Systems  Constitutional: Negative for chills, activity change, appetite change, fatigue and unexpected weight change.  HENT: Negative for congestion, mouth sores and sinus pressure.   Eyes: Negative for visual disturbance.  Respiratory: Negative for cough and chest tightness.   Gastrointestinal: Negative for nausea and abdominal pain.  Genitourinary: Negative for frequency, difficulty urinating and vaginal pain.  Musculoskeletal: Negative for back pain and gait problem.  Skin: Negative for pallor and rash.  Neurological: Negative for dizziness, tremors, weakness, numbness and headaches.  Psychiatric/Behavioral: Negative for suicidal ideas, confusion and sleep disturbance. The patient is not nervous/anxious.     Objective:  BP 120/70 mmHg  Pulse 53  Wt 202 lb (91.627 kg)  SpO2 96%  BP Readings from Last 3 Encounters:  07/05/15 120/70  01/03/15 128/86  12/29/13 120/80    Wt Readings from Last 3  Encounters:  07/05/15 202 lb (91.627 kg)  01/03/15 214 lb (97.07 kg)  12/29/13 214 lb (97.07 kg)    Physical Exam  Constitutional: She appears well-developed. No distress.  HENT:  Head: Normocephalic.  Right Ear: External ear normal.  Left Ear: External ear normal.  Nose: Nose normal.  Mouth/Throat: Oropharynx is clear and moist.  Eyes: Conjunctivae are normal. Pupils are equal, round, and reactive to light. Right eye exhibits no discharge. Left eye exhibits no discharge.  Neck: Normal range of motion. Neck supple. No JVD present. No tracheal deviation present. No thyromegaly present.  Cardiovascular: Normal rate, regular rhythm and normal heart sounds.   Pulmonary/Chest: No stridor. No respiratory distress. She has no wheezes.  Abdominal: Soft. Bowel sounds are normal. She exhibits no distension and no mass. There is no tenderness. There is no rebound and no guarding.  Genitourinary: No vaginal discharge found.  Musculoskeletal: She exhibits no edema or tenderness.  Lymphadenopathy:    She has no cervical adenopathy.  Neurological: She displays normal reflexes. No cranial nerve deficit. She exhibits normal muscle tone. Coordination abnormal.  Skin: No rash noted. No erythema.  Psychiatric: She has a normal mood and affect. Her behavior is normal. Judgment and thought content normal.   EKG S brady  Lab Results  Component Value Date   WBC 8.3 01/04/2015   HGB 11.5* 01/04/2015   HCT 34.7* 01/04/2015   PLT 255.0 01/04/2015   GLUCOSE 96 01/04/2015   CHOL 91 01/04/2015  TRIG 84.0 01/04/2015   HDL 42.20 01/04/2015   LDLCALC 32 01/04/2015   ALT 12 01/04/2015   AST 14 01/04/2015   NA 138 01/04/2015   K 4.1 01/04/2015   CL 104 01/04/2015   CREATININE 1.00 01/04/2015   BUN 16 01/04/2015   CO2 29 01/04/2015   TSH 2.14 01/04/2015   HGBA1C 6.4 12/05/2012    US Soft Tissue Head/neck  01/20/2014  CLINICAL DATA:  goiter EXAM: THYROID ULTRASOUND TECHNIQUE: Ultrasound examination  of the thyroid gland and adjacent soft tissues was performed. COMPARISON:  12/03/2011 FINDINGS: Right thyroid lobe Measurements: 47 x 16 x 21 mm. Multiple small cystic lesions. Largest 4 x 4 x 6 mm, inferior pole. Remainder less than 5 mm. Left thyroid lobe Measurements: 51 x 17 x 21 mm. Multiple small cysts. Largest 13 x 6 x 10 mm, inferior pole (previously 12 x 6 x 9). Remainder less than 5 mm. Isthmus Thickness: 4 mm.  No nodules visualized. Lymphadenopathy None visualized. Bilateral cervical nodes less than 6 mm short axis diameter are noted. IMPRESSION: 1. Thyromegaly with multiple small cysts. Findings do not meet current consensus criteria for biopsy. Follow-up by clinical exam is recommended. If patient has known risk factors for thyroid carcinoma, consider follow-up ultrasound in 12 months. If patient is clinically hyperthyroid, consider nuclear medicine thyroid uptake and scan. This recommendation follows the consensus statement: Management of Thyroid Nodules Detected as [place]: Society of Radiologists in [place]. Radiology 2005; [id number]. Electronically Signed   By: Arne Cleveland M.D.   On: 01/20/2014 11:37    Assessment & Plan:   There are no diagnoses linked to this encounter. I am having [name] maintain her loratadine, cholecalciferol, fexofenadine-pseudoephedrine, Guaifenesin-Codeine, amLODipine, carvedilol, and omeprazole.  No orders of the defined types were placed in this encounter.     Follow-up: No Follow-up on file.  Walker Kehr, MD

## 2015-07-05 NOTE — Assessment & Plan Note (Signed)
Labs

## 2015-07-05 NOTE — Assessment & Plan Note (Signed)
Wt Readings from Last 3 Encounters:  07/05/15 202 lb (91.627 kg)  01/03/15 214 lb (97.07 kg)  12/29/13 214 lb (97.07 kg)

## 2015-07-05 NOTE — Assessment & Plan Note (Signed)
Recovered  

## 2015-10-14 LAB — HM MAMMOGRAPHY

## 2015-10-17 DIAGNOSIS — D259 Leiomyoma of uterus, unspecified: Secondary | ICD-10-CM | POA: Insufficient documentation

## 2015-12-07 ENCOUNTER — Encounter: Payer: Self-pay | Admitting: Obstetrics & Gynecology

## 2016-01-02 ENCOUNTER — Ambulatory Visit (INDEPENDENT_AMBULATORY_CARE_PROVIDER_SITE_OTHER)
Admission: RE | Admit: 2016-01-02 | Discharge: 2016-01-02 | Disposition: A | Discharge status: Home / Self Care | Payer: BLUE CROSS/BLUE SHIELD | Source: Ambulatory Visit | Attending: Internal Medicine | Admitting: Internal Medicine

## 2016-01-02 ENCOUNTER — Other Ambulatory Visit (INDEPENDENT_AMBULATORY_CARE_PROVIDER_SITE_OTHER): Payer: BLUE CROSS/BLUE SHIELD

## 2016-01-02 ENCOUNTER — Ambulatory Visit (INDEPENDENT_AMBULATORY_CARE_PROVIDER_SITE_OTHER): Payer: BLUE CROSS/BLUE SHIELD | Admitting: Internal Medicine

## 2016-01-02 ENCOUNTER — Encounter: Payer: Self-pay | Admitting: Internal Medicine

## 2016-01-02 VITALS — BP 120/86 | HR 58 | Temp 98.2°F | Ht 68.0 in | Wt 205.0 lb

## 2016-01-02 DIAGNOSIS — R072 Precordial pain: Secondary | ICD-10-CM

## 2016-01-02 DIAGNOSIS — Z0000 Encounter for general adult medical examination without abnormal findings: Secondary | ICD-10-CM | POA: Diagnosis not present

## 2016-01-02 DIAGNOSIS — R05 Cough: Secondary | ICD-10-CM | POA: Diagnosis not present

## 2016-01-02 DIAGNOSIS — R0789 Other chest pain: Secondary | ICD-10-CM

## 2016-01-02 DIAGNOSIS — J069 Acute upper respiratory infection, unspecified: Secondary | ICD-10-CM | POA: Diagnosis not present

## 2016-01-02 DIAGNOSIS — I10 Essential (primary) hypertension: Secondary | ICD-10-CM | POA: Diagnosis not present

## 2016-01-02 DIAGNOSIS — R059 Cough, unspecified: Secondary | ICD-10-CM

## 2016-01-02 LAB — URINALYSIS
BILIRUBIN URINE: NEGATIVE
HGB URINE DIPSTICK: NEGATIVE
KETONES UR: NEGATIVE
LEUKOCYTES UA: NEGATIVE
NITRITE: NEGATIVE
PH: 7 (ref 5.0–8.0)
Specific Gravity, Urine: 1.01 (ref 1.000–1.030)
Total Protein, Urine: NEGATIVE
UROBILINOGEN UA: 0.2 (ref 0.0–1.0)
Urine Glucose: NEGATIVE

## 2016-01-02 LAB — CBC WITH DIFFERENTIAL/PLATELET
BASOS ABS: 0 10*3/uL (ref 0.0–0.1)
BASOS PCT: 0.5 % (ref 0.0–3.0)
EOS ABS: 0.5 10*3/uL (ref 0.0–0.7)
Eosinophils Relative: 6.2 % — ABNORMAL HIGH (ref 0.0–5.0)
HCT: 35.5 % — ABNORMAL LOW (ref 36.0–46.0)
Hemoglobin: 11.9 g/dL — ABNORMAL LOW (ref 12.0–15.0)
LYMPHS ABS: 2.2 10*3/uL (ref 0.7–4.0)
Lymphocytes Relative: 27.2 % (ref 12.0–46.0)
MCHC: 33.6 g/dL (ref 30.0–36.0)
MCV: 84.9 fl (ref 78.0–100.0)
Monocytes Absolute: 0.5 10*3/uL (ref 0.1–1.0)
Monocytes Relative: 6.2 % (ref 3.0–12.0)
NEUTROS ABS: 4.8 10*3/uL (ref 1.4–7.7)
NEUTROS PCT: 59.9 % (ref 43.0–77.0)
PLATELETS: 262 10*3/uL (ref 150.0–400.0)
RBC: 4.19 Mil/uL (ref 3.87–5.11)
RDW: 14.6 % (ref 11.5–15.5)
WBC: 8 10*3/uL (ref 4.0–10.5)

## 2016-01-02 LAB — HEPATIC FUNCTION PANEL
ALK PHOS: 75 U/L (ref 39–117)
ALT: 12 U/L (ref 0–35)
AST: 15 U/L (ref 0–37)
Albumin: 4 g/dL (ref 3.5–5.2)
BILIRUBIN DIRECT: 0 mg/dL (ref 0.0–0.3)
BILIRUBIN TOTAL: 0.4 mg/dL (ref 0.2–1.2)
Total Protein: 7.6 g/dL (ref 6.0–8.3)

## 2016-01-02 LAB — BASIC METABOLIC PANEL
BUN: 14 mg/dL (ref 6–23)
CHLORIDE: 105 meq/L (ref 96–112)
CO2: 29 mEq/L (ref 19–32)
CREATININE: 0.94 mg/dL (ref 0.40–1.20)
Calcium: 9.1 mg/dL (ref 8.4–10.5)
GFR: 77.28 mL/min (ref >60.00)
Glucose, Bld: 91 mg/dL (ref 70–99)
POTASSIUM: 4.2 meq/L (ref 3.5–5.1)
Sodium: 139 mEq/L (ref 135–145)

## 2016-01-02 LAB — LIPID PANEL
CHOLESTEROL: 96 mg/dL (ref 0–200)
HDL: 51.7 mg/dL (ref >39.00)
LDL CALC: 32 mg/dL (ref 0–99)
NonHDL: 44.58
TRIGLYCERIDES: 65 mg/dL (ref 0.0–149.0)
Total CHOL/HDL Ratio: 2
VLDL: 13 mg/dL (ref 0.0–40.0)

## 2016-01-02 LAB — HEPATITIS C ANTIBODY: HCV AB: NEGATIVE

## 2016-01-02 LAB — TSH: TSH: 2.65 u[IU]/mL (ref 0.35–4.50)

## 2016-01-02 MED ORDER — AMLODIPINE BESYLATE 10 MG PO TABS: 10.0000 mg | ORAL_TABLET | Freq: Every day | ORAL | 3 refills | Status: DC

## 2016-01-02 MED ORDER — CARVEDILOL 25 MG PO TABS: 25.0000 mg | ORAL_TABLET | Freq: Two times a day (BID) | ORAL | 3 refills | Status: DC

## 2016-01-02 MED ORDER — PROMETHAZINE-CODEINE 6.25-10 MG/5ML PO SYRP: 5.0000 mL | ORAL_SOLUTION | ORAL | 0 refills | Status: DC | PRN

## 2016-01-02 MED ORDER — OMEPRAZOLE 40 MG PO CPDR: 40.0000 mg | DELAYED_RELEASE_CAPSULE | Freq: Every day | ORAL | 11 refills | Status: DC

## 2016-01-02 NOTE — Progress Notes (Signed)
Pre visit review using our clinic review tool, if applicable. No additional management support is needed unless otherwise documented below in the visit note. 

## 2016-01-02 NOTE — Assessment & Plan Note (Addendum)
We discussed age appropriate health related issues, including available/recomended screening tests and vaccinations. We discussed a need for adhering to healthy diet and exercise. Labs/EKG were reviewed/ordered. All questions were answered. Declined colonoscopy now - planning to do it later this year Pt had a PAP, mammo this summer

## 2016-01-02 NOTE — Progress Notes (Signed)
Subjective:  Patient ID: Molly Newman, female    DOB: 1950-12-02  Age: 65 y.o. MRN: [id number]  CC: Annual Exam   HPI Molly Newman presents for a well exam; just had mammo, PAP C/o a bad cough and a stopped up nose x 3 d, CP  Outpatient Medications Prior to Visit  Medication Sig Dispense Refill  . amLODipine (NORVASC) 10 MG tablet Take 1 tablet (10 mg total) by mouth daily. 90 tablet 3  . carvedilol (COREG) 25 MG tablet Take 1 tablet (25 mg total) by mouth 2 (two) times daily with a meal. 180 tablet 3  . Cholecalciferol (VITAMIN D3) 1000 UNITS tablet Take 1,000 Units by mouth daily.      . fexofenadine-pseudoephedrine (ALLEGRA-D 24) 180-240 MG per 24 hr tablet Take 1 tablet by mouth daily.      Marland Kitchen omeprazole (PRILOSEC) 40 MG capsule Take 1 capsule (40 mg total) by mouth daily. 30 capsule 11  . Guaifenesin-Codeine 300-10 MG/5ML LIQD 5-10 ml po qid prn cough (Patient not taking: Reported on 01/02/2016) 300 mL 0  . loratadine (CLARITIN) 10 MG tablet Take 10 mg by mouth daily.       No facility-administered medications prior to visit.     ROS Review of Systems  Constitutional: Negative for activity change, appetite change, chills, fatigue and unexpected weight change.  HENT: Positive for sinus pressure. Negative for congestion and mouth sores.   Eyes: Negative for visual disturbance.  Respiratory: Positive for cough. Negative for chest tightness.   Gastrointestinal: Negative for abdominal pain and nausea.  Genitourinary: Negative for difficulty urinating, frequency and vaginal pain.  Musculoskeletal: Negative for back pain and gait problem.  Skin: Negative for pallor and rash.  Neurological: Negative for dizziness, tremors, weakness, numbness and headaches.  Psychiatric/Behavioral: Negative for confusion and sleep disturbance.    Objective:  BP 120/86   Pulse (!) 58   Temp 98.2 F (36.8 C) (Oral)   Ht 5\' 8"  (1.727 m)   Wt 205 lb (93 kg)   SpO2 98%   BMI 31.17  kg/m   BP Readings from Last 3 Encounters:  01/02/16 120/86  07/05/15 120/70  01/03/15 128/86    Wt Readings from Last 3 Encounters:  01/02/16 205 lb (93 kg)  07/05/15 202 lb (91.6 kg)  01/03/15 214 lb (97.1 kg)    Physical Exam  Constitutional: She appears well-developed. No distress.  HENT:  Head: Normocephalic.  Right Ear: External ear normal.  Left Ear: External ear normal.  Nose: Nose normal.  Mouth/Throat: Oropharynx is clear and moist.  Eyes: Conjunctivae are normal. Pupils are equal, round, and reactive to light. Right eye exhibits no discharge. Left eye exhibits no discharge.  Neck: Normal range of motion. Neck supple. No JVD present. No tracheal deviation present. No thyromegaly present.  Cardiovascular: Normal rate, regular rhythm and normal heart sounds.   Pulmonary/Chest: No stridor. No respiratory distress. She has no wheezes.  Abdominal: Soft. Bowel sounds are normal. She exhibits no distension and no mass. There is no tenderness. There is no rebound and no guarding.  Musculoskeletal: She exhibits no edema or tenderness.  Lymphadenopathy:    She has no cervical adenopathy.  Neurological: She displays normal reflexes. No cranial nerve deficit. She exhibits normal muscle tone. Coordination normal.  Skin: No rash noted. No erythema.  Psychiatric: She has a normal mood and affect. Her behavior is normal. Judgment and thought content normal.  eryth throat  Procedure: EKG Indication: chest pain Impression:  NSR. No acute changes.   Lab Results  Component Value Date   WBC 8.8 07/05/2015   HGB 11.4 (L) 07/05/2015   HCT 34.5 (L) 07/05/2015   PLT 288.0 07/05/2015   GLUCOSE 100 (H) 07/05/2015   CHOL 91 01/04/2015   TRIG 84.0 01/04/2015   HDL 42.20 01/04/2015   LDLCALC 32 01/04/2015   ALT 12 01/04/2015   AST 14 01/04/2015   NA 139 07/05/2015   K 4.2 07/05/2015   CL 104 07/05/2015   CREATININE 0.96 07/05/2015   BUN 13 07/05/2015   CO2 29 07/05/2015   TSH  2.31 07/05/2015   HGBA1C 6.4 12/05/2012    US Soft Tissue Head/neck  Result Date: 01/20/2014 CLINICAL DATA:  goiter EXAM: THYROID ULTRASOUND TECHNIQUE: Ultrasound examination of the thyroid gland and adjacent soft tissues was performed. COMPARISON:  12/03/2011 FINDINGS: Right thyroid lobe Measurements: 47 x 16 x 21 mm. Multiple small cystic lesions. Largest 4 x 4 x 6 mm, inferior pole. Remainder less than 5 mm. Left thyroid lobe Measurements: 51 x 17 x 21 mm. Multiple small cysts. Largest 13 x 6 x 10 mm, inferior pole (previously 12 x 6 x 9). Remainder less than 5 mm. Isthmus Thickness: 4 mm.  No nodules visualized. Lymphadenopathy None visualized. Bilateral cervical nodes less than 6 mm short axis diameter are noted. IMPRESSION: 1. Thyromegaly with multiple small cysts. Findings do not meet current consensus criteria for biopsy. Follow-up by clinical exam is recommended. If patient has known risk factors for thyroid carcinoma, consider follow-up ultrasound in 12 months. If patient is clinically hyperthyroid, consider nuclear medicine thyroid uptake and scan. This recommendation follows the consensus statement: Management of Thyroid Nodules Detected as [place]: Society of Radiologists in [place]. Radiology 2005; [id number]. Electronically Signed   By: Arne Cleveland M.D.   On: 01/20/2014 11:37    Assessment & Plan:   There are no diagnoses linked to this encounter. I am having [name] maintain her loratadine, cholecalciferol, fexofenadine-pseudoephedrine, Guaifenesin-Codeine, amLODipine, carvedilol, and omeprazole.  No orders of the defined types were placed in this encounter.    Follow-up: No Follow-up on file.  Walker Kehr, MD

## 2016-01-02 NOTE — Assessment & Plan Note (Signed)
EKG CXR Prom-cod syr

## 2016-01-02 NOTE — Assessment & Plan Note (Signed)
On Amlodipine, Coreg 

## 2016-01-02 NOTE — Patient Instructions (Signed)
Use over-the-counter  "cold" medicines  such as "Afrin" nasal spray for nasal congestion as directed instead. Use" Delsym" or" Robitussin" cough syrup varietis for cough.  You can use plain "Tylenol" or "Advil" for fever, chills and achyness. Use Halls or Ricola cough drops.   "Common cold" symptoms are usually triggered by a virus.  The antibiotics are usually not necessary. On average, a" viral cold" illness would take 4-7 days to resolve. Please, make an appointment if you are not better or if you're worse.  

## 2016-01-02 NOTE — Assessment & Plan Note (Addendum)
Prom-cod syr Rx OTC meds

## 2016-01-03 ENCOUNTER — Encounter: Payer: Self-pay | Admitting: Internal Medicine

## 2016-05-09 ENCOUNTER — Encounter: Payer: Self-pay | Admitting: Obstetrics & Gynecology

## 2016-12-11 ENCOUNTER — Telehealth: Payer: Self-pay | Admitting: Internal Medicine

## 2016-12-11 MED ORDER — CARVEDILOL 25 MG PO TABS: 25.0000 mg | ORAL_TABLET | Freq: Two times a day (BID) | ORAL | 0 refills | Status: DC

## 2016-12-11 MED ORDER — AMLODIPINE BESYLATE 10 MG PO TABS: 10.0000 mg | ORAL_TABLET | Freq: Every day | ORAL | 0 refills | Status: DC

## 2016-12-11 NOTE — Telephone Encounter (Signed)
RX sent

## 2016-12-11 NOTE — Telephone Encounter (Signed)
Patient had to reschedule appt due to provider being out of the office.  Is requesting carvedilol and amlodipine to be sent to [facility] on [address].  Patient is requesting 30 day supply of each.  Patient has rescheduled appt to 9/6.

## 2017-01-03 ENCOUNTER — Encounter: Payer: BLUE CROSS/BLUE SHIELD | Admitting: Internal Medicine

## 2017-01-04 ENCOUNTER — Encounter: Payer: BLUE CROSS/BLUE SHIELD | Admitting: Internal Medicine

## 2017-01-10 ENCOUNTER — Encounter: Payer: BLUE CROSS/BLUE SHIELD | Admitting: Internal Medicine

## 2017-01-10 ENCOUNTER — Ambulatory Visit (INDEPENDENT_AMBULATORY_CARE_PROVIDER_SITE_OTHER): Payer: BLUE CROSS/BLUE SHIELD | Admitting: Internal Medicine

## 2017-01-10 ENCOUNTER — Encounter: Payer: Self-pay | Admitting: Internal Medicine

## 2017-01-10 ENCOUNTER — Other Ambulatory Visit (INDEPENDENT_AMBULATORY_CARE_PROVIDER_SITE_OTHER): Payer: BLUE CROSS/BLUE SHIELD

## 2017-01-10 VITALS — BP 120/68 | HR 58 | Temp 99.1°F | Ht 68.0 in | Wt 208.4 lb

## 2017-01-10 DIAGNOSIS — Z0000 Encounter for general adult medical examination without abnormal findings: Secondary | ICD-10-CM

## 2017-01-10 DIAGNOSIS — I10 Essential (primary) hypertension: Secondary | ICD-10-CM

## 2017-01-10 DIAGNOSIS — Z23 Encounter for immunization: Secondary | ICD-10-CM

## 2017-01-10 DIAGNOSIS — E049 Nontoxic goiter, unspecified: Secondary | ICD-10-CM

## 2017-01-10 DIAGNOSIS — R7309 Other abnormal glucose: Secondary | ICD-10-CM | POA: Diagnosis not present

## 2017-01-10 DIAGNOSIS — J301 Allergic rhinitis due to pollen: Secondary | ICD-10-CM

## 2017-01-10 LAB — CBC WITH DIFFERENTIAL/PLATELET
Basophils Absolute: 0.1 10*3/uL (ref 0.0–0.1)
Basophils Relative: 1.1 % (ref 0.0–3.0)
EOS PCT: 3.4 % (ref 0.0–5.0)
Eosinophils Absolute: 0.3 10*3/uL (ref 0.0–0.7)
HEMATOCRIT: 36.1 % (ref 36.0–46.0)
Hemoglobin: 11.6 g/dL — ABNORMAL LOW (ref 12.0–15.0)
Lymphocytes Relative: 35.1 % (ref 12.0–46.0)
Lymphs Abs: 2.9 10*3/uL (ref 0.7–4.0)
MCHC: 32.2 g/dL (ref 30.0–36.0)
MCV: 88.9 fl (ref 78.0–100.0)
MONOS PCT: 7.1 % (ref 3.0–12.0)
Monocytes Absolute: 0.6 10*3/uL (ref 0.1–1.0)
Neutro Abs: 4.4 10*3/uL (ref 1.4–7.7)
Neutrophils Relative %: 53.3 % (ref 43.0–77.0)
Platelets: 256 10*3/uL (ref 150.0–400.0)
RBC: 4.06 Mil/uL (ref 3.87–5.11)
RDW: 14.4 % (ref 11.5–15.5)
WBC: 8.3 10*3/uL (ref 4.0–10.5)

## 2017-01-10 LAB — BASIC METABOLIC PANEL
BUN: 19 mg/dL (ref 6–23)
CALCIUM: 9.5 mg/dL (ref 8.4–10.5)
CO2: 28 mEq/L (ref 19–32)
Chloride: 103 mEq/L (ref 96–112)
Creatinine, Ser: 1.08 mg/dL (ref 0.40–1.20)
GFR: 65.62 mL/min (ref >60.00)
GLUCOSE: 93 mg/dL (ref 70–99)
POTASSIUM: 4 meq/L (ref 3.5–5.1)
SODIUM: 139 meq/L (ref 135–145)

## 2017-01-10 LAB — URINALYSIS
Bilirubin Urine: NEGATIVE
HGB URINE DIPSTICK: NEGATIVE
Ketones, ur: NEGATIVE
Leukocytes, UA: NEGATIVE
Nitrite: NEGATIVE
PH: 7 (ref 5.0–8.0)
SPECIFIC GRAVITY, URINE: 1.01 (ref 1.000–1.030)
TOTAL PROTEIN, URINE-UPE24: NEGATIVE
URINE GLUCOSE: NEGATIVE
Urobilinogen, UA: 0.2 (ref 0.0–1.0)

## 2017-01-10 LAB — HEPATIC FUNCTION PANEL
ALBUMIN: 4 g/dL (ref 3.5–5.2)
ALK PHOS: 74 U/L (ref 39–117)
ALT: 12 U/L (ref 0–35)
AST: 18 U/L (ref 0–37)
BILIRUBIN TOTAL: 0.4 mg/dL (ref 0.2–1.2)
Bilirubin, Direct: 0.1 mg/dL (ref 0.0–0.3)
Total Protein: 7.5 g/dL (ref 6.0–8.3)

## 2017-01-10 LAB — TSH: TSH: 1.84 u[IU]/mL (ref 0.35–4.50)

## 2017-01-10 LAB — LIPID PANEL
CHOL/HDL RATIO: 2
CHOLESTEROL: 99 mg/dL (ref 0–200)
HDL: 52.3 mg/dL (ref >39.00)
LDL CALC: 35 mg/dL (ref 0–99)
NonHDL: 46.97
Triglycerides: 61 mg/dL (ref 0.0–149.0)
VLDL: 12.2 mg/dL (ref 0.0–40.0)

## 2017-01-10 MED ORDER — CARVEDILOL 25 MG PO TABS: 25.0000 mg | ORAL_TABLET | Freq: Two times a day (BID) | ORAL | 11 refills | Status: DC

## 2017-01-10 MED ORDER — AMLODIPINE BESYLATE 10 MG PO TABS: 10.0000 mg | ORAL_TABLET | Freq: Every day | ORAL | 11 refills | Status: DC

## 2017-01-10 MED ORDER — OMEPRAZOLE 40 MG PO CPDR: 40.0000 mg | DELAYED_RELEASE_CAPSULE | Freq: Every day | ORAL | 11 refills | Status: DC

## 2017-01-10 NOTE — Assessment & Plan Note (Signed)
We discussed age appropriate health related issues, including available/recomended screening tests and vaccinations. We discussed a need for adhering to healthy diet and exercise. Labs were ordered to be later reviewed . All questions were answered.   

## 2017-01-10 NOTE — Assessment & Plan Note (Signed)
Repeat US

## 2017-01-10 NOTE — Assessment & Plan Note (Signed)
Labs

## 2017-01-10 NOTE — Assessment & Plan Note (Signed)
On Amlodipine, Coreg 

## 2017-01-10 NOTE — Assessment & Plan Note (Signed)
Allegra D

## 2017-01-10 NOTE — Progress Notes (Signed)
Subjective:  Patient ID: Molly Newman, female    DOB: Feb 04, 1951  Age: 66 y.o. MRN: [id number]  CC: Annual Exam   HPI HUMAIRA SCULLEY presents for well exam Stress w/mom - 77 yo  Outpatient Medications Prior to Visit  Medication Sig Dispense Refill  . amLODipine (NORVASC) 10 MG tablet Take 1 tablet (10 mg total) by mouth daily. 30 tablet 0  . carvedilol (COREG) 25 MG tablet Take 1 tablet (25 mg total) by mouth 2 (two) times daily with a meal. 60 tablet 0  . Cholecalciferol (VITAMIN D3) 1000 UNITS tablet Take 1,000 Units by mouth daily.      . fexofenadine-pseudoephedrine (ALLEGRA-D 24) 180-240 MG per 24 hr tablet Take 1 tablet by mouth daily.      Marland Kitchen omeprazole (PRILOSEC) 40 MG capsule Take 1 capsule (40 mg total) by mouth daily. 30 capsule 11  . promethazine-codeine (PHENERGAN WITH CODEINE) 6.25-10 MG/5ML syrup Take 5 mLs by mouth every 4 (four) hours as needed. 300 mL 0  . Guaifenesin-Codeine 300-10 MG/5ML LIQD 5-10 ml po qid prn cough (Patient not taking: Reported on 01/02/2016) 300 mL 0  . loratadine (CLARITIN) 10 MG tablet Take 10 mg by mouth daily.       No facility-administered medications prior to visit.     ROS Review of Systems  Constitutional: Negative for activity change, appetite change, chills, fatigue and unexpected weight change.  HENT: Negative for congestion, mouth sores and sinus pressure.   Eyes: Negative for visual disturbance.  Respiratory: Negative for cough and chest tightness.   Gastrointestinal: Negative for abdominal pain and nausea.  Genitourinary: Negative for difficulty urinating, frequency and vaginal pain.  Musculoskeletal: Negative for back pain and gait problem.  Skin: Negative for pallor and rash.  Neurological: Negative for dizziness, tremors, weakness, numbness and headaches.  Psychiatric/Behavioral: Negative for confusion and sleep disturbance.    Objective:  BP 120/68   Pulse (!) 58   Temp 99.1 F (37.3 C) (Oral)   Ht 5\' 8"   (1.727 m)   Wt 208 lb 6.4 oz (94.5 kg)   SpO2 98%   BMI 31.69 kg/m   BP Readings from Last 3 Encounters:  01/10/17 120/68  01/02/16 120/86  07/05/15 120/70    Wt Readings from Last 3 Encounters:  01/10/17 208 lb 6.4 oz (94.5 kg)  01/02/16 205 lb (93 kg)  07/05/15 202 lb (91.6 kg)    Physical Exam  Constitutional: She appears well-developed. No distress.  HENT:  Head: Normocephalic.  Right Ear: External ear normal.  Left Ear: External ear normal.  Nose: Nose normal.  Mouth/Throat: Oropharynx is clear and moist.  Eyes: Pupils are equal, round, and reactive to light. Conjunctivae are normal. Right eye exhibits no discharge. Left eye exhibits no discharge.  Neck: Normal range of motion. Neck supple. No JVD present. No tracheal deviation present. No thyromegaly present.  Cardiovascular: Normal rate, regular rhythm and normal heart sounds.   Pulmonary/Chest: No stridor. No respiratory distress. She has no wheezes.  Abdominal: Soft. Bowel sounds are normal. She exhibits no distension and no mass. There is no tenderness. There is no rebound and no guarding.  Musculoskeletal: She exhibits no edema or tenderness.  Lymphadenopathy:    She has no cervical adenopathy.  Neurological: She displays normal reflexes. No cranial nerve deficit. She exhibits normal muscle tone. Coordination normal.  Skin: No rash noted. No erythema.  Psychiatric: She has a normal mood and affect. Her behavior is normal. Judgment and thought content  normal.  goiter R>L  Procedure: EKG Indication: palpitations Impression: NSR - S brady. No acute changes.   Lab Results  Component Value Date   WBC 8.0 01/02/2016   HGB 11.9 (L) 01/02/2016   HCT 35.5 (L) 01/02/2016   PLT 262.0 01/02/2016   GLUCOSE 91 01/02/2016   CHOL 96 01/02/2016   TRIG 65.0 01/02/2016   HDL 51.70 01/02/2016   LDLCALC 32 01/02/2016   ALT 12 01/02/2016   AST 15 01/02/2016   NA 139 01/02/2016   K 4.2 01/02/2016   CL 105 01/02/2016    CREATININE 0.94 01/02/2016   BUN 14 01/02/2016   CO2 29 01/02/2016   TSH 2.65 01/02/2016   HGBA1C 6.4 12/05/2012    Dg Chest 2 View  Result Date: 01/02/2016 CLINICAL DATA:  Cough, congestion for 2 days. EXAM: CHEST  2 VIEW COMPARISON:  09/08/2010 FINDINGS: Heart is borderline in size. Lungs are clear. No effusions or acute bony abnormality. IMPRESSION: No active cardiopulmonary disease. Electronically Signed   By: Rolm Baptise M.D.   On: 01/02/2016 11:29    Assessment & Plan:   There are no diagnoses linked to this encounter. I have discontinued Ms. Buras loratadine. I am also having her maintain her cholecalciferol, fexofenadine-pseudoephedrine, Guaifenesin-Codeine, omeprazole, promethazine-codeine, amLODipine, and carvedilol.  No orders of the defined types were placed in this encounter.    Follow-up: No Follow-up on file.  Walker Kehr, MD

## 2017-02-06 ENCOUNTER — Other Ambulatory Visit: Payer: BLUE CROSS/BLUE SHIELD

## 2017-02-06 ENCOUNTER — Ambulatory Visit
Admission: RE | Admit: 2017-02-06 | Discharge: 2017-02-06 | Disposition: A | Discharge status: Home / Self Care | Payer: BLUE CROSS/BLUE SHIELD | Source: Ambulatory Visit | Attending: Internal Medicine | Admitting: Internal Medicine

## 2017-02-06 DIAGNOSIS — E049 Nontoxic goiter, unspecified: Secondary | ICD-10-CM

## 2017-06-27 ENCOUNTER — Encounter: Payer: Self-pay | Admitting: Family Medicine

## 2017-06-27 ENCOUNTER — Ambulatory Visit: Payer: BLUE CROSS/BLUE SHIELD | Admitting: Family Medicine

## 2017-06-27 DIAGNOSIS — R21 Rash and other nonspecific skin eruption: Secondary | ICD-10-CM

## 2017-06-27 NOTE — Progress Notes (Signed)
  CYNIA ABRUZZO - 67 y.o. female MRN [id number]  Date of birth: 1951/02/27  SUBJECTIVE:  Including CC & ROS.  Chief Complaint  Patient presents with  . Rash    KINDSEY EBLIN is a 67 y.o. female that is presenting with a rash. She states she cannot tolerate chocolate and the past month she has been eating a lot of chocolate. She noticed the rash four days ago. She has been taking Benadryl with no improvement. Rash located on her lower right abdomen. Denies itchiness and tenderness.   Review of Systems  Constitutional: Negative for fever.  Respiratory: Negative for cough.   Cardiovascular: Negative for chest pain.  Gastrointestinal: Negative for abdominal pain.  Musculoskeletal: Negative for back pain.  Skin: Positive for rash.    HISTORY: Past Medical, Surgical, Social, and Family History Reviewed & Updated per EMR.   Pertinent Historical Findings include:  Past Medical History:  Diagnosis Date  . Allergy   . Hypertension     No past surgical history on file.  Allergies  Allergen Reactions  . Ace Inhibitors   . Diltiazem Hcl     REACTION: Hair Loss    Family History  Problem Relation Age of Onset  . Hypertension Other      Social History   Socioeconomic History  . Marital status: Single    Spouse name: Not on file  . Number of children: Not on file  . Years of education: Not on file  . Highest education level: Not on file  Social Needs  . Financial resource strain: Not on file  . Food insecurity - worry: Not on file  . Food insecurity - inability: Not on file  . Transportation needs - medical: Not on file  . Transportation needs - non-medical: Not on file  Occupational History  . Not on file  Tobacco Use  . Smoking status: Never Smoker  . Smokeless tobacco: Never Used  Substance and Sexual Activity  . Alcohol use: No  . Drug use: No  . Sexual activity: Not on file  Other Topics Concern  . Not on file  Social History Narrative  . Not on  file     PHYSICAL EXAM:  VS: BP 130/78 (BP Location: Left Arm, Patient Position: Sitting, Cuff Size: Normal)   Pulse 65   Temp 98.7 F (37.1 C) (Oral)   Ht 5\' 8"  (1.727 m)   Wt 203 lb (92.1 kg)   SpO2 98%   BMI 30.87 kg/m  Physical Exam Gen: NAD, alert, cooperative with exam,  ENT: normal lips, normal nasal mucosa,  Eye: normal EOM, normal conjunctiva and lids CV:  no edema, +2 pedal pulses   Resp: no accessory muscle use, non-labored,  Skin: vesicular rash on right flank at varying stages and with crusting, no areas of induration  Neuro: normal tone, normal sensation to touch Psych:  normal insight, alert and oriented MSK: normal gait, normal strength       ASSESSMENT & PLAN:   Rash Appears to be occurring along the cervical dermatome. Possible for shingles. Does not appear to be infected. - Counseled on supportive care - Given indications the follow-up - Counseled on the vaccine.

## 2017-06-27 NOTE — Assessment & Plan Note (Signed)
Appears to be occurring along the cervical dermatome. Possible for shingles. Does not appear to be infected. - Counseled on supportive care - Given indications the follow-up - Counseled on the vaccine.

## 2017-06-27 NOTE — Patient Instructions (Signed)
Please continue to take Tylenol for the pain. Please follow-up with her symptoms don't seem to be improving. Pleases follow up to get the vaccine

## 2017-07-01 ENCOUNTER — Telehealth: Payer: Self-pay | Admitting: *Deleted

## 2017-07-01 MED ORDER — GABAPENTIN 100 MG PO CAPS: 100.0000 mg | ORAL_CAPSULE | Freq: Three times a day (TID) | ORAL | 3 refills | Status: DC

## 2017-07-01 NOTE — Telephone Encounter (Signed)
Patient notified Gabapentin sent in to her pharmacy.

## 2017-07-01 NOTE — Telephone Encounter (Signed)
Copied from [place] [phone]. Topic: General - Other >> Jul 01, 2017  8:39 AM Yvette Rack wrote: Reason for CRM: patient states that she had saw Raeford Razor for a rash and that he thought it was shingles but he didn't give her any medicine now she has started feeling burning in the area on Sunday she would like to get medicine for the rash please call pt when you send the medicine to her pharmacy

## 2017-08-01 ENCOUNTER — Telehealth: Payer: Self-pay | Admitting: Internal Medicine

## 2017-08-01 DIAGNOSIS — Z1211 Encounter for screening for malignant neoplasm of colon: Secondary | ICD-10-CM

## 2017-08-01 NOTE — Telephone Encounter (Signed)
Copied from [place] [phone]. Topic: Referral - Request >> Aug 01, 2017 11:54 AM Margot Ables wrote: Reason for CRM: Pt calling requesting colonoscopy to be done. She said she was supposed to do it last year but her mother was sick. Pt states that she needs to have it done by Sep 04, 2017 for her insurance.

## 2017-08-02 ENCOUNTER — Encounter: Payer: Self-pay | Admitting: Internal Medicine

## 2017-08-02 NOTE — Telephone Encounter (Signed)
Referral placed.

## 2017-08-09 ENCOUNTER — Ambulatory Visit (AMBULATORY_SURGERY_CENTER): Payer: Self-pay | Admitting: *Deleted

## 2017-08-09 ENCOUNTER — Other Ambulatory Visit: Payer: Self-pay

## 2017-08-09 ENCOUNTER — Encounter: Payer: Self-pay | Admitting: Internal Medicine

## 2017-08-09 VITALS — Ht 68.0 in | Wt 207.2 lb

## 2017-08-09 DIAGNOSIS — Z1211 Encounter for screening for malignant neoplasm of colon: Secondary | ICD-10-CM

## 2017-08-09 MED ORDER — NA SULFATE-K SULFATE-MG SULF 17.5-3.13-1.6 GM/177ML PO SOLN: 1.0000 [IU] | Freq: Once | ORAL | 0 refills | Status: AC

## 2017-08-09 NOTE — Progress Notes (Signed)
No egg or soy allergy known to patient  No issues with past sedation with any surgeries  or procedures, no intubation problems  Never been sedated No diet pills per patient   No home 02 use per patient  No blood thinners per patient  Pt denies issues with constipation sometimes when taking sinus medications No A fib or A flutter  EMMI video sent to pt's e mail

## 2017-08-13 ENCOUNTER — Telehealth: Payer: Self-pay | Admitting: Internal Medicine

## 2017-08-13 NOTE — Telephone Encounter (Signed)
Pt states prep $100 with her insurance.  Informed we can call in coupon to drop price to $50 dollars- pt states she can do this but no more  [facility] and gave coupon info -to Edward Qualia [id number] PCN [id number] GROUP [id number] ID [id number]  INFORMED PT TO CALL WITH FURTHER QUESTIONS OR PROBLEMS   Lelan Pons PV

## 2017-08-13 NOTE — Telephone Encounter (Signed)
Pt cannot afford prep for colon scheduled on Monday 08/19/17. Please give her a call.

## 2017-08-19 ENCOUNTER — Encounter: Payer: Self-pay | Admitting: Internal Medicine

## 2017-08-19 ENCOUNTER — Ambulatory Visit (AMBULATORY_SURGERY_CENTER): Payer: BLUE CROSS/BLUE SHIELD | Admitting: Internal Medicine

## 2017-08-19 ENCOUNTER — Other Ambulatory Visit: Payer: Self-pay

## 2017-08-19 VITALS — BP 114/78 | HR 61 | Temp 97.8°F | Resp 15 | Ht 68.0 in | Wt 203.0 lb

## 2017-08-19 DIAGNOSIS — D123 Benign neoplasm of transverse colon: Secondary | ICD-10-CM | POA: Diagnosis not present

## 2017-08-19 DIAGNOSIS — Z1211 Encounter for screening for malignant neoplasm of colon: Secondary | ICD-10-CM | POA: Diagnosis not present

## 2017-08-19 DIAGNOSIS — D122 Benign neoplasm of ascending colon: Secondary | ICD-10-CM | POA: Diagnosis not present

## 2017-08-19 MED ORDER — SODIUM CHLORIDE 0.9 % IV SOLN: 500.0000 mL | Freq: Once | INTRAVENOUS | Status: DC

## 2017-08-19 NOTE — Patient Instructions (Signed)
Patient consents to observer being present for procedure. Patient consents to observer being present for procedure. YOU HAD AN ENDOSCOPIC PROCEDURE TODAY AT [facility]:   Refer to the procedure report that was given to you for any specific questions about what was found during the examination.  If the procedure report does not answer your questions, please call your gastroenterologist to clarify.  If you requested that your care partner not be given the details of your procedure findings, then the procedure report has been included in a sealed envelope for you to review at your convenience later.  YOU SHOULD EXPECT: Some feelings of bloating in the abdomen. Passage of more gas than usual.  Walking can help get rid of the air that was put into your GI tract during the procedure and reduce the bloating. If you had a lower endoscopy (such as a colonoscopy or flexible sigmoidoscopy) you may notice spotting of blood in your stool or on the toilet paper. If you underwent a bowel prep for your procedure, you may not have a normal bowel movement for a few days.  Please Note:  You might notice some irritation and congestion in your nose or some drainage.  This is from the oxygen used during your procedure.  There is no need for concern and it should clear up in a day or so.  SYMPTOMS TO REPORT IMMEDIATELY:   Following lower endoscopy (colonoscopy or flexible sigmoidoscopy):  Excessive amounts of blood in the stool  Significant tenderness or worsening of abdominal pains  Swelling of the abdomen that is new, acute  Fever of 100F or higher  For urgent or emergent issues, a gastroenterologist can be reached at any hour by calling [phone].   DIET:  We do recommend a small meal at first, but then you may proceed to your regular diet.  Drink plenty of fluids but you should avoid alcoholic beverages for 24 hours.  ACTIVITY:  You should plan to take it easy for the rest of today and you  should NOT DRIVE or use heavy machinery until tomorrow (because of the sedation medicines used during the test).    FOLLOW UP: Our staff will call the number listed on your records the next business day following your procedure to check on you and address any questions or concerns that you may have regarding the information given to you following your procedure. If we do not reach you, we will leave a message.  However, if you are feeling well and you are not experiencing any problems, there is no need to return our call.  We will assume that you have returned to your regular daily activities without incident.  If any biopsies were taken you will be contacted by phone or by letter within the next 1-3 weeks.  Please call us at [phone] if you have not heard about the biopsies in 3 weeks.    SIGNATURES/CONFIDENTIALITY: You and/or your care partner have signed paperwork which will be entered into your electronic medical record.  These signatures attest to the fact that that the information above on your After Visit Summary has been reviewed and is understood.  Full responsibility of the confidentiality of this discharge information lies with you and/or your care-partner.    Handouts were given to your care partner on polyps and diverticulosis. You may resume your current medications today. Await biopsy results. Please call if any questions or concerns.

## 2017-08-19 NOTE — Progress Notes (Signed)
Pt's states no medical or surgical changes since previsit or office visit. 

## 2017-08-19 NOTE — Progress Notes (Signed)
No problems noted in the recovery room. maw 

## 2017-08-19 NOTE — Progress Notes (Signed)
Report given to PACU, vss 

## 2017-08-19 NOTE — Progress Notes (Signed)
Called to room to assist during endoscopic procedure.  Patient ID and intended procedure confirmed with present staff. Received instructions for my participation in the procedure from the performing physician.  

## 2017-08-19 NOTE — Op Note (Signed)
[place] Patient Name: Molly Newman Procedure Date: 08/19/2017 8:37 AM MRN: [id number] Endoscopist: Jerene Bears , MD Age: 67 Referring MD:  Date of Birth: 04-04-1951 Gender: Female Account #: [id number] Procedure:                Colonoscopy Indications:              Screening for colorectal malignant neoplasm, This                            is the patient's first colonoscopy Medicines:                Monitored Anesthesia Care Procedure:                Pre-Anesthesia Assessment:                           - Prior to the procedure, a History and Physical                            was performed, and patient medications and                            allergies were reviewed. The patient's tolerance of                            previous anesthesia was also reviewed. The risks                            and benefits of the procedure and the sedation                            options and risks were discussed with the patient.                            All questions were answered, and informed consent                            was obtained. Prior Anticoagulants: The patient has                            taken no previous anticoagulant or antiplatelet                            agents. ASA Grade Assessment: II - A patient with                            mild systemic disease. After reviewing the risks                            and benefits, the patient was deemed in                            satisfactory condition to undergo the procedure.  After obtaining informed consent, the colonoscope                            was passed under direct vision. Throughout the                            procedure, the patient's blood pressure, pulse, and                            oxygen saturations were monitored continuously. The                            Colonoscope was introduced through the anus and                            advanced to the the  cecum, identified by                            appendiceal orifice and ileocecal valve. The                            colonoscopy was performed without difficulty. The                            patient tolerated the procedure well. The quality                            of the bowel preparation was good. The ileocecal                            valve, appendiceal orifice, and rectum were                            photographed. Scope In: 8:50:19 AM Scope Out: 3:24:40 AM Scope Withdrawal Time: 0 hours 20 minutes 14 seconds  Total Procedure Duration: 0 hours 26 minutes 15 seconds  Findings:                 The digital rectal exam was normal.                           Two sessile polyps were found in the ascending                            colon. The polyps were 6 to 8 mm in size. These                            polyps were removed with a cold snare. Resection                            and retrieval were complete.                           Five sessile polyps were found in the transverse  colon. The polyps were 4 to 7 mm in size. These                            polyps were removed with a cold snare. Resection                            and retrieval were complete.                           Multiple small-mouthed diverticula were found in                            the sigmoid colon and ascending colon.                           The retroflexed view of the distal rectum and anal                            verge was normal and showed no anal or rectal                            abnormalities. Complications:            No immediate complications. Estimated Blood Loss:     Estimated blood loss was minimal. Impression:               - Two 6 to 8 mm polyps in the ascending colon,                            removed with a cold snare. Resected and retrieved.                           - Five 4 to 7 mm polyps in the transverse colon,                             removed with a cold snare. Resected and retrieved.                           - Mild diverticulosis in the sigmoid colon and in                            the ascending colon.                           - The distal rectum and anal verge are normal on                            retroflexion view. Recommendation:           - Patient has a contact number available for                            emergencies. The signs and symptoms of potential  delayed complications were discussed with the                            patient. Return to normal activities tomorrow.                            Written discharge instructions were provided to the                            patient.                           - Resume previous diet.                           - Continue present medications.                           - Await pathology results.                           - Repeat colonoscopy in 3 years for surveillance. Jerene Bears, MD 08/19/2017 9:21:39 AM This report has been signed electronically.

## 2017-08-20 ENCOUNTER — Telehealth: Payer: Self-pay | Admitting: *Deleted

## 2017-08-20 NOTE — Telephone Encounter (Signed)
  Follow up Call-  Call back number 08/19/2017  Post procedure Call Back phone  # [phone]  Permission to leave phone message Yes  Some recent data might be hidden     Patient questions:  Do you have a fever, pain , or abdominal swelling? No. Pain Score  0 *  Have you tolerated food without any problems? Yes.    Have you been able to return to your normal activities? Yes.    Do you have any questions about your discharge instructions: Diet   No. Medications  No. Follow up visit  No.  Do you have questions or concerns about your Care? No.  Actions: * If pain score is 4 or above: No action needed, pain <4.

## 2017-08-26 ENCOUNTER — Encounter: Payer: Self-pay | Admitting: Internal Medicine

## 2017-09-08 IMAGING — DX DG CHEST 2V
2 series · 2 of 2 positions shown · non-contrast
Comparison: 09/08/2010

CLINICAL DATA: Cough, congestion for 2 days.

EXAM:
CHEST  2 VIEW

[chest pa]
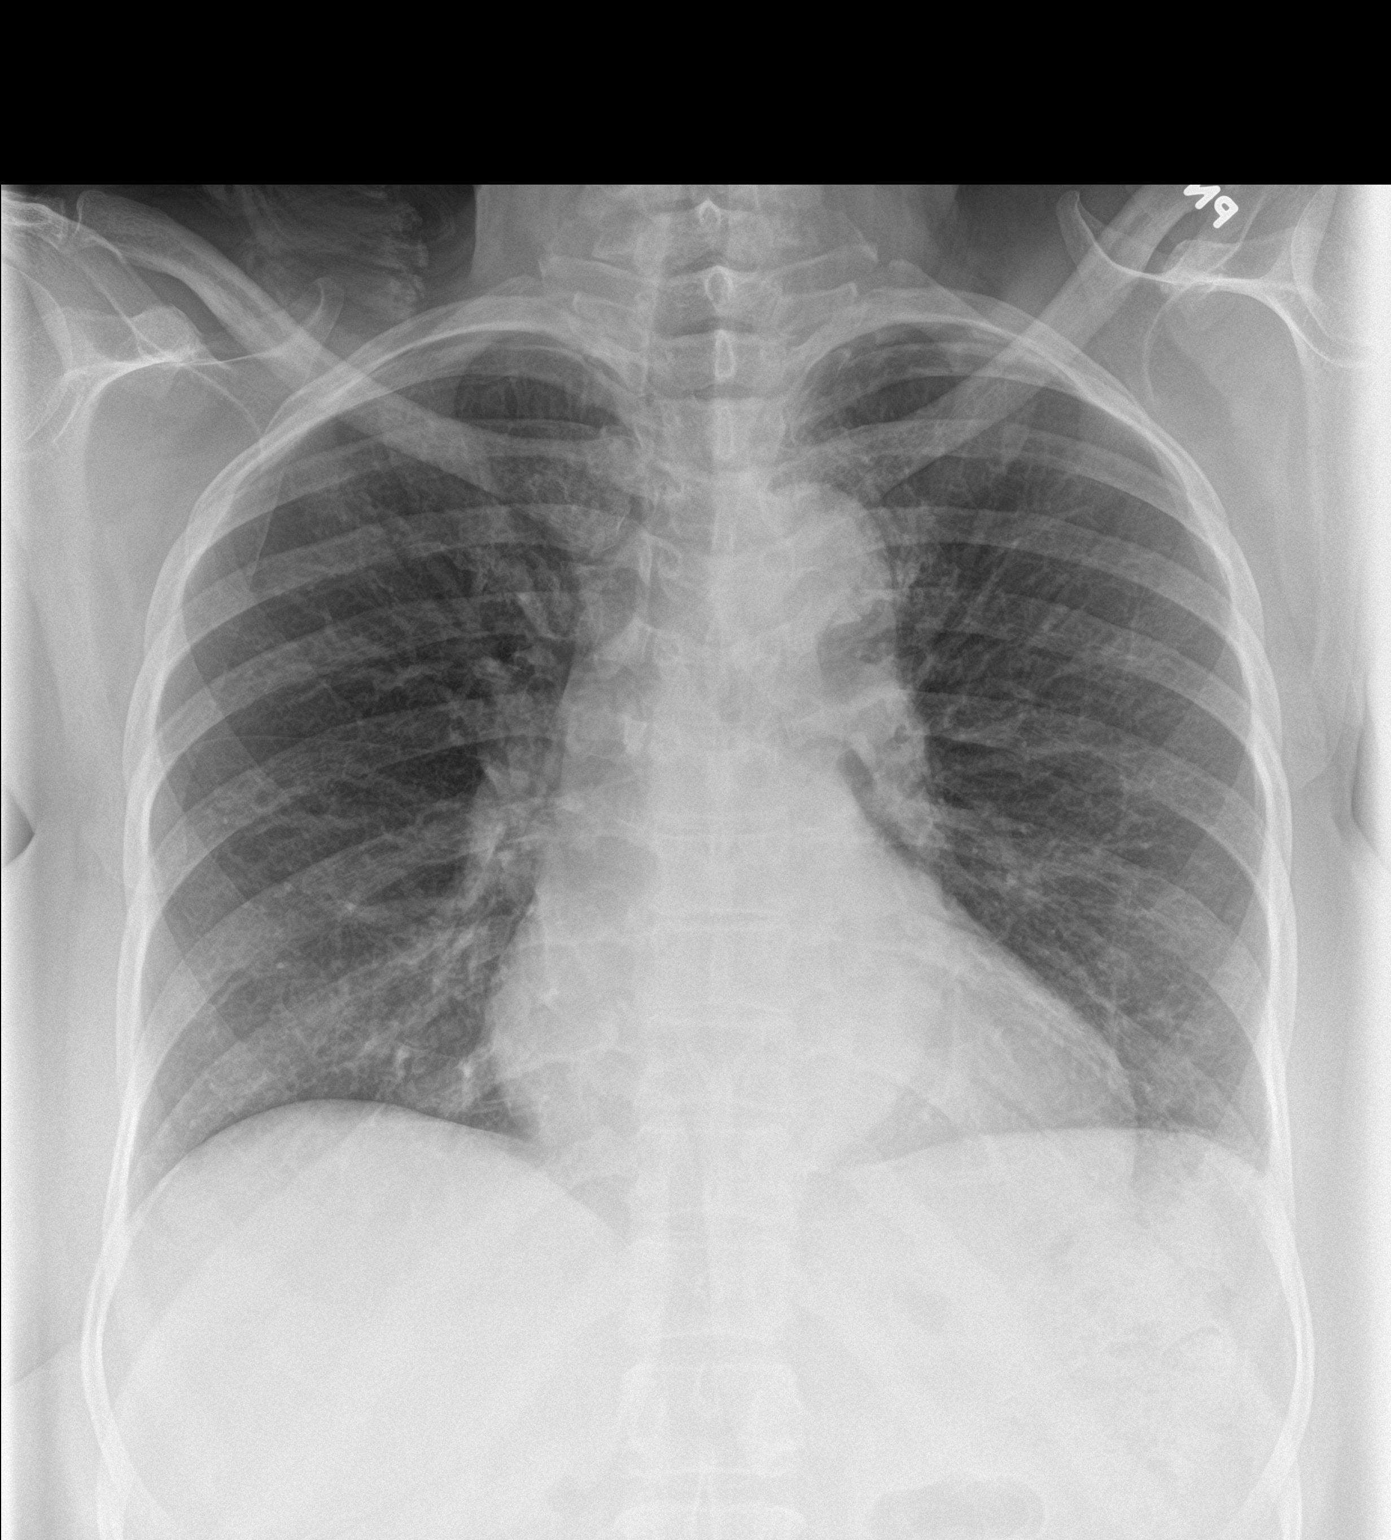

[chest lat]
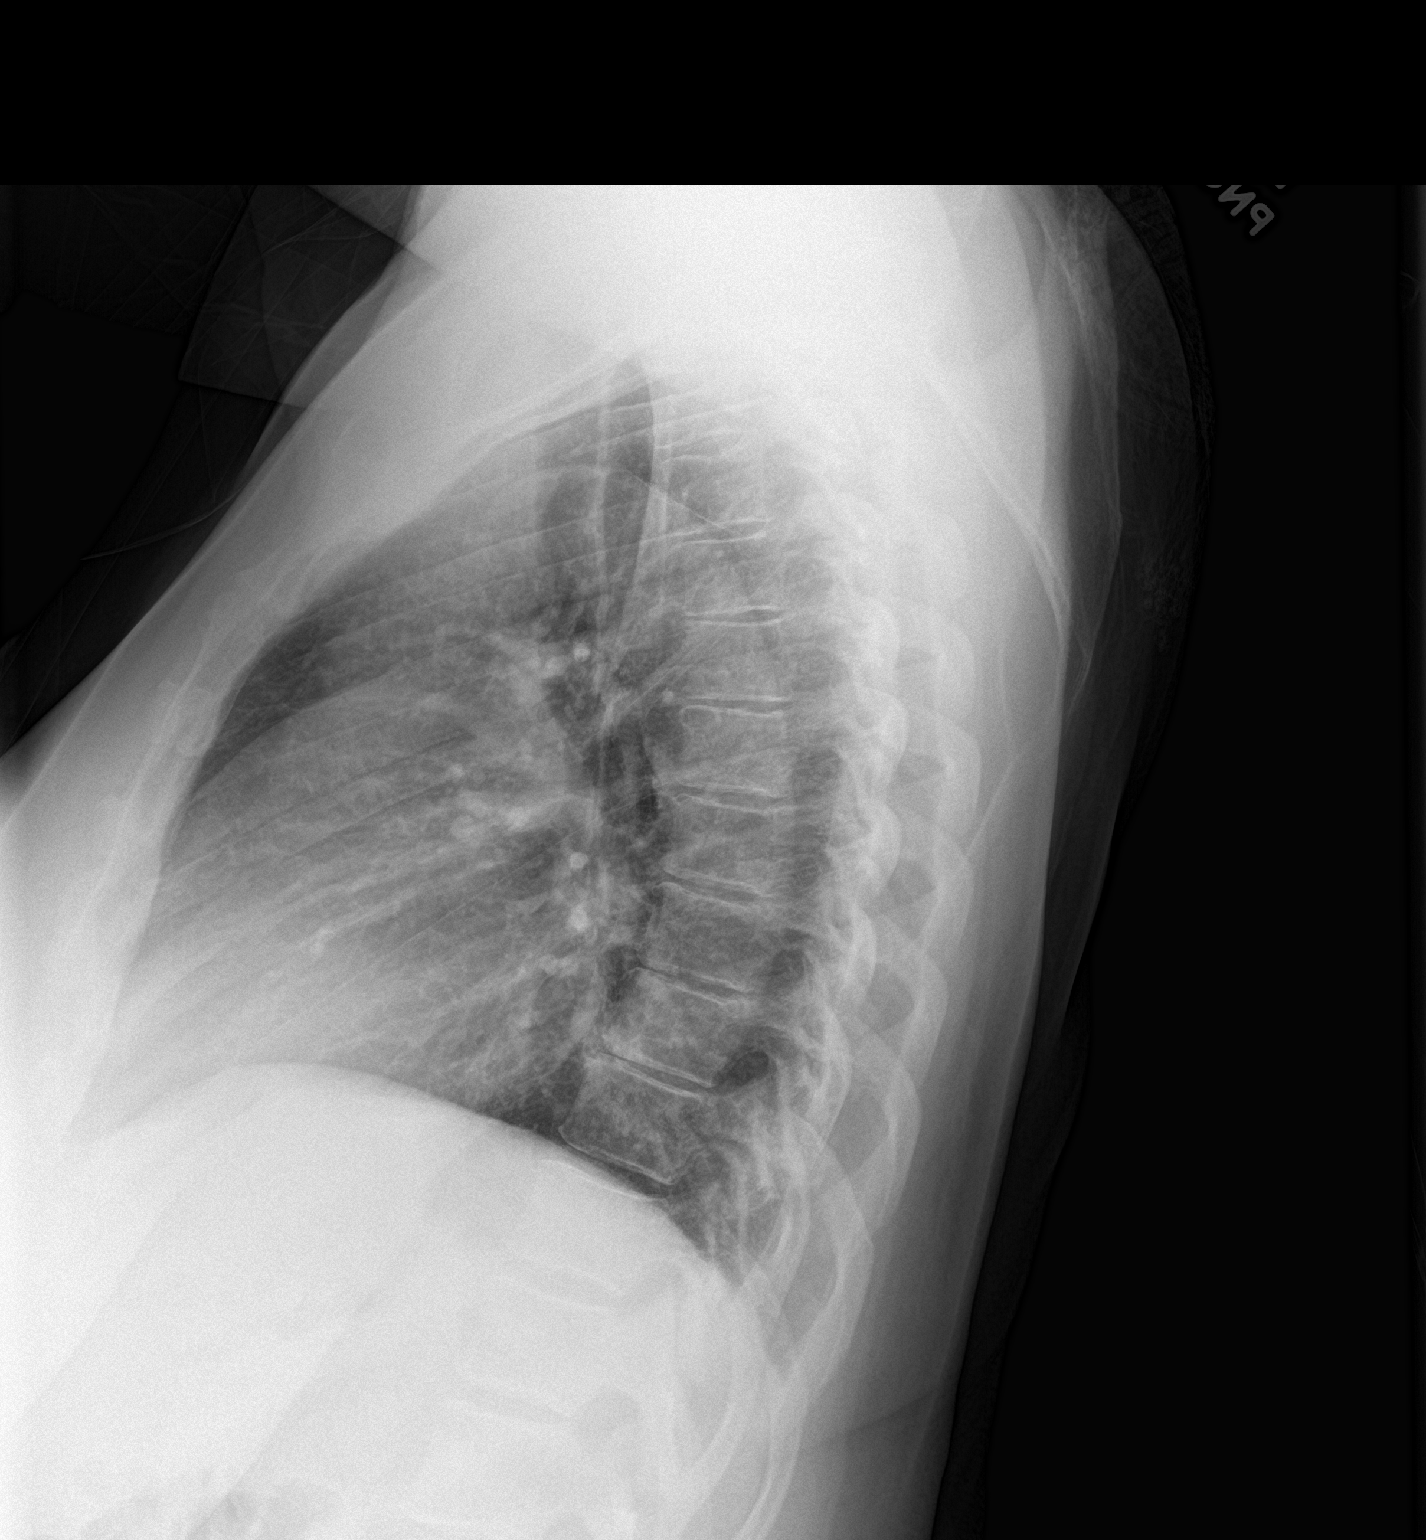

[2 of 2 positions shown; findings below may reference images not displayed]

FINDINGS: Heart is borderline in size. Lungs are clear. No effusions or acute
bony abnormality.
IMPRESSION: No active cardiopulmonary disease.

## 2017-10-31 DIAGNOSIS — Z1231 Encounter for screening mammogram for malignant neoplasm of breast: Secondary | ICD-10-CM | POA: Diagnosis not present

## 2017-10-31 DIAGNOSIS — N3281 Overactive bladder: Secondary | ICD-10-CM | POA: Diagnosis not present

## 2017-10-31 DIAGNOSIS — D259 Leiomyoma of uterus, unspecified: Secondary | ICD-10-CM | POA: Diagnosis not present

## 2017-11-11 DIAGNOSIS — D259 Leiomyoma of uterus, unspecified: Secondary | ICD-10-CM | POA: Diagnosis not present

## 2018-01-01 ENCOUNTER — Other Ambulatory Visit: Payer: Self-pay | Admitting: Internal Medicine

## 2018-01-21 ENCOUNTER — Encounter: Payer: Self-pay | Admitting: Internal Medicine

## 2018-01-21 ENCOUNTER — Other Ambulatory Visit (INDEPENDENT_AMBULATORY_CARE_PROVIDER_SITE_OTHER): Payer: Medicare HMO

## 2018-01-21 ENCOUNTER — Ambulatory Visit (INDEPENDENT_AMBULATORY_CARE_PROVIDER_SITE_OTHER): Payer: Medicare HMO | Admitting: Internal Medicine

## 2018-01-21 VITALS — BP 118/76 | HR 63 | Temp 98.0°F | Ht 68.0 in | Wt 209.0 lb

## 2018-01-21 DIAGNOSIS — I10 Essential (primary) hypertension: Secondary | ICD-10-CM | POA: Diagnosis not present

## 2018-01-21 DIAGNOSIS — Z0000 Encounter for general adult medical examination without abnormal findings: Secondary | ICD-10-CM | POA: Diagnosis not present

## 2018-01-21 DIAGNOSIS — R7309 Other abnormal glucose: Secondary | ICD-10-CM | POA: Diagnosis not present

## 2018-01-21 LAB — URINALYSIS
Bilirubin Urine: NEGATIVE
Hgb urine dipstick: NEGATIVE
Ketones, ur: NEGATIVE
LEUKOCYTES UA: NEGATIVE
Nitrite: NEGATIVE
PH: 6.5 (ref 5.0–8.0)
Specific Gravity, Urine: <=1.005 — AB (ref 1.000–1.030)
Total Protein, Urine: NEGATIVE
URINE GLUCOSE: NEGATIVE
Urobilinogen, UA: 0.2 (ref 0.0–1.0)

## 2018-01-21 LAB — HEPATIC FUNCTION PANEL
ALBUMIN: 3.9 g/dL (ref 3.5–5.2)
ALT: 10 U/L (ref 0–35)
AST: 13 U/L (ref 0–37)
Alkaline Phosphatase: 78 U/L (ref 39–117)
Bilirubin, Direct: 0.1 mg/dL (ref 0.0–0.3)
Total Bilirubin: 0.3 mg/dL (ref 0.2–1.2)
Total Protein: 7.5 g/dL (ref 6.0–8.3)

## 2018-01-21 LAB — BASIC METABOLIC PANEL
BUN: 18 mg/dL (ref 6–23)
CALCIUM: 9.2 mg/dL (ref 8.4–10.5)
CHLORIDE: 103 meq/L (ref 96–112)
CO2: 26 meq/L (ref 19–32)
Creatinine, Ser: 1.05 mg/dL (ref 0.40–1.20)
GFR: 67.57 mL/min (ref >60.00)
Glucose, Bld: 90 mg/dL (ref 70–99)
POTASSIUM: 3.9 meq/L (ref 3.5–5.1)
SODIUM: 137 meq/L (ref 135–145)

## 2018-01-21 LAB — CBC WITH DIFFERENTIAL/PLATELET
BASOS ABS: 0.1 10*3/uL (ref 0.0–0.1)
Basophils Relative: 0.9 % (ref 0.0–3.0)
Eosinophils Absolute: 0.3 10*3/uL (ref 0.0–0.7)
Eosinophils Relative: 3.5 % (ref 0.0–5.0)
HCT: 34.3 % — ABNORMAL LOW (ref 36.0–46.0)
Hemoglobin: 11.2 g/dL — ABNORMAL LOW (ref 12.0–15.0)
LYMPHS ABS: 2.7 10*3/uL (ref 0.7–4.0)
Lymphocytes Relative: 36.3 % (ref 12.0–46.0)
MCHC: 32.7 g/dL (ref 30.0–36.0)
MCV: 87.3 fl (ref 78.0–100.0)
MONOS PCT: 5.6 % (ref 3.0–12.0)
Monocytes Absolute: 0.4 10*3/uL (ref 0.1–1.0)
NEUTROS ABS: 4 10*3/uL (ref 1.4–7.7)
NEUTROS PCT: 53.7 % (ref 43.0–77.0)
PLATELETS: 266 10*3/uL (ref 150.0–400.0)
RBC: 3.93 Mil/uL (ref 3.87–5.11)
RDW: 14.1 % (ref 11.5–15.5)
WBC: 7.4 10*3/uL (ref 4.0–10.5)

## 2018-01-21 LAB — LIPID PANEL
CHOL/HDL RATIO: 2
Cholesterol: 86 mg/dL (ref 0–200)
HDL: 43.9 mg/dL (ref >39.00)
LDL Cholesterol: 27 mg/dL (ref 0–99)
NonHDL: 42.27
TRIGLYCERIDES: 77 mg/dL (ref 0.0–149.0)
VLDL: 15.4 mg/dL (ref 0.0–40.0)

## 2018-01-21 LAB — TSH: TSH: 2.61 u[IU]/mL (ref 0.35–4.50)

## 2018-01-21 LAB — HEMOGLOBIN A1C: HEMOGLOBIN A1C: 6.3 % (ref 4.6–6.5)

## 2018-01-21 MED ORDER — CARVEDILOL 25 MG PO TABS: 25.0000 mg | ORAL_TABLET | Freq: Two times a day (BID) | ORAL | 11 refills | Status: DC

## 2018-01-21 MED ORDER — OMEPRAZOLE 40 MG PO CPDR: 40.0000 mg | DELAYED_RELEASE_CAPSULE | Freq: Every day | ORAL | 11 refills | Status: DC

## 2018-01-21 MED ORDER — AMLODIPINE BESYLATE 10 MG PO TABS: 10.0000 mg | ORAL_TABLET | Freq: Every day | ORAL | 11 refills | Status: DC

## 2018-01-21 MED ORDER — ZOSTER VAC RECOMB ADJUVANTED 50 MCG/0.5ML IM SUSR: 0.5000 mL | Freq: Once | INTRAMUSCULAR | 1 refills | Status: AC

## 2018-01-21 NOTE — Assessment & Plan Note (Signed)
On Amlodipine, Coreg 

## 2018-01-21 NOTE — Assessment & Plan Note (Signed)
A1c

## 2018-01-21 NOTE — Patient Instructions (Signed)

## 2018-01-21 NOTE — Assessment & Plan Note (Addendum)
We discussed age appropriate health related issues, including available/recomended screening tests and vaccinations. We discussed a need for adhering to healthy diet and exercise. Labs ordered. All questions were answered. Caring for a 67 yo mother Colon due 2022 - polyps (Dr Hilarie Fredrickson) Eye exam q 12 mo Mammo, PAP

## 2018-01-21 NOTE — Progress Notes (Signed)
Subjective:  Patient ID: Molly Newman, female    DOB: [birth year]  Age: 67 y.o. MRN: [id number]  CC: No chief complaint on file.   HPI Molly Newman presents for a well exam  Outpatient Medications Prior to Visit  Medication Sig Dispense Refill  . amLODipine (NORVASC) 10 MG tablet Take 1 tablet (10 mg total) by mouth daily. 30 tablet 11  . carvedilol (COREG) 25 MG tablet Take 1 tablet (25 mg total) by mouth 2 (two) times daily with a meal. 60 tablet 11  . Cholecalciferol (VITAMIN D3) 1000 UNITS tablet Take 1,000 Units by mouth daily.      . fexofenadine-pseudoephedrine (ALLEGRA-D 24) 180-240 MG per 24 hr tablet Take 1 tablet by mouth daily.      Marland Kitchen omeprazole (PRILOSEC) 40 MG capsule TAKE 1 CAPSULE BY MOUTH ONCE DAILY 30 capsule 2  . tolterodine (DETROL LA) 4 MG 24 hr capsule Take 4 mg by mouth as needed.     Facility-Administered Medications Prior to Visit  Medication Dose Route Frequency Provider Last Rate Last Dose  . 0.9 %  sodium chloride infusion  500 mL Intravenous Once Pyrtle, Lajuan Lines, MD        ROS: Review of Systems  Constitutional: Positive for unexpected weight change. Negative for activity change, appetite change, chills and fatigue.  HENT: Negative for congestion, mouth sores and sinus pressure.   Eyes: Negative for visual disturbance.  Respiratory: Negative for cough and chest tightness.   Gastrointestinal: Negative for abdominal pain and nausea.  Genitourinary: Negative for difficulty urinating, frequency and vaginal pain.  Musculoskeletal: Negative for back pain and gait problem.  Skin: Negative for pallor and rash.  Neurological: Negative for dizziness, tremors, weakness, numbness and headaches.  Psychiatric/Behavioral: Negative for confusion, sleep disturbance and suicidal ideas.    Objective:  BP 118/76 (BP Location: Left Arm, Patient Position: Sitting, Cuff Size: Large)   Pulse 63   Temp 98 F (36.7 C) (Oral)   Ht 5\' 8"  (1.727 m)   Wt 209 lb  (94.8 kg)   SpO2 97%   BMI 31.78 kg/m   BP Readings from Last 3 Encounters:  01/21/18 118/76  08/19/17 114/78  06/27/17 130/78    Wt Readings from Last 3 Encounters:  01/21/18 209 lb (94.8 kg)  08/19/17 203 lb (92.1 kg)  08/09/17 207 lb 3.2 oz (94 kg)    Physical Exam  Constitutional: She appears well-developed. No distress.  HENT:  Head: Normocephalic.  Right Ear: External ear normal.  Left Ear: External ear normal.  Nose: Nose normal.  Mouth/Throat: Oropharynx is clear and moist.  Eyes: Pupils are equal, round, and reactive to light. Conjunctivae are normal. Right eye exhibits no discharge. Left eye exhibits no discharge.  Neck: Normal range of motion. Neck supple. No JVD present. No tracheal deviation present. No thyromegaly present.  Cardiovascular: Normal rate, regular rhythm and normal heart sounds.  Pulmonary/Chest: No stridor. No respiratory distress. She has no wheezes.  Abdominal: Soft. Bowel sounds are normal. She exhibits no distension and no mass. There is no tenderness. There is no rebound and no guarding.  Musculoskeletal: She exhibits no edema or tenderness.  Lymphadenopathy:    She has no cervical adenopathy.  Neurological: She displays normal reflexes. No cranial nerve deficit. She exhibits normal muscle tone. Coordination normal.  Skin: No rash noted. No erythema.  Psychiatric: She has a normal mood and affect. Her behavior is normal. Judgment and thought content normal.  Obese  Lab Results  Component Value Date   WBC 8.3 01/10/2017   HGB 11.6 (L) 01/10/2017   HCT 36.1 01/10/2017   PLT 256.0 01/10/2017   GLUCOSE 93 01/10/2017   CHOL 99 01/10/2017   TRIG 61.0 01/10/2017   HDL 52.30 01/10/2017   LDLCALC 35 01/10/2017   ALT 12 01/10/2017   AST 18 01/10/2017   NA 139 01/10/2017   K 4.0 01/10/2017   CL 103 01/10/2017   CREATININE 1.08 01/10/2017   BUN 19 01/10/2017   CO2 28 01/10/2017   TSH 1.84 01/10/2017   HGBA1C 6.4 12/05/2012    US  Thyroid  Result Date: 02/06/2017 CLINICAL DATA:  Goiter. EXAM: THYROID ULTRASOUND TECHNIQUE: Ultrasound examination of the thyroid gland and adjacent soft tissues was performed. COMPARISON:  01/20/2014 FINDINGS: Parenchymal Echotexture: Mildly heterogenous Isthmus: 0.5 cm, previously 0.4 cm Right lobe: 4.8 x 1.6 x 2.5 cm, previously 4.7 x 1.6 x 2.1 cm Left lobe: 5.5 x 1.7 x 1.9 cm, previously 5.1 x 1.7 x 2.1 cm _________________________________________________________ Estimated total number of nodules >/= 1 cm: 2 Number of spongiform nodules >/=  2 cm not described below (TR1): 0 Number of mixed cystic and solid nodules >/= 1.5 cm not described below ([place]): 0 _________________________________________________________ Nodule versus pseudo nodule # 1: Location: Right; Inferior Maximum size: 1.1 cm; Other 2 dimensions: 1.0 x 1.0 cm Composition: solid/almost completely solid (2) Echogenicity: hypoechoic (2) Shape: not taller-than-wide (0) Margins: ill-defined (0) Echogenic foci: none (0) ACR TI-RADS total points: 4. ACR TI-RADS risk category: TR4 (4-6 points). ACR TI-RADS recommendations: *Given size (>/= 1 - 1.4 cm) and appearance, a follow-up ultrasound in 1 year should be considered based on TI-RADS criteria. _________________________________________________________ Benign appearing cysts are seen bilaterally. Other smaller nodules are scattered throughout both lobes which do not meet criteria for biopsy or follow-up. IMPRESSION: Right lower pole nodule 1 meets criteria for annual follow-up. Other nodules are either benign cystic lesions or do not meet criteria for follow-up or biopsy based on size. The above is in keeping with the ACR TI-RADS recommendations - J Am Coll Radiol 2017;14:587-595. Electronically Signed   By: Marybelle Killings M.D.   On: 02/06/2017 14:41    Assessment & Plan:   There are no diagnoses linked to this encounter.   No orders of the defined types were placed in this  encounter.    Follow-up: No follow-ups on file.  Walker Kehr, MD

## 2018-01-22 ENCOUNTER — Other Ambulatory Visit: Payer: Self-pay | Admitting: Internal Medicine

## 2018-01-22 NOTE — Telephone Encounter (Signed)
Copied from [place] [id number]. Topic: Quick Communication - Rx Refill/Question >> Jan 22, 2018  9:02 AM Reyne Dumas L wrote: Medication:  amLODipine (NORVASC) 10 MG tablet carvedilol (COREG) 25 MG tablet omeprazole (PRILOSEC) 40 MG capsule  Has the patient contacted their pharmacy? No - forgot to mention this at [place] yesterday. (Agent: If no, request that the patient contact the pharmacy for the refill.) (Agent: If yes, when and what did the pharmacy advise?)  Preferred Pharmacy (with phone number or street name): [facility] [phone] - [place], [address] AT [facility] [address] [place] [phone] (Phone) [phone] (Fax)  Agent: Please be advised that RX refills may take up to 3 business days. We ask that you follow-up with your pharmacy.

## 2018-01-22 NOTE — Telephone Encounter (Signed)
Meds refilled on 01/21/18. Pt notified.

## 2018-01-26 ENCOUNTER — Other Ambulatory Visit: Payer: Self-pay | Admitting: Internal Medicine

## 2018-04-09 ENCOUNTER — Other Ambulatory Visit: Payer: Self-pay | Admitting: Internal Medicine

## 2019-01-10 ENCOUNTER — Other Ambulatory Visit: Payer: Self-pay | Admitting: Internal Medicine

## 2019-01-29 DIAGNOSIS — Z01419 Encounter for gynecological examination (general) (routine) without abnormal findings: Secondary | ICD-10-CM | POA: Diagnosis not present

## 2019-01-29 DIAGNOSIS — Z1231 Encounter for screening mammogram for malignant neoplasm of breast: Secondary | ICD-10-CM | POA: Diagnosis not present

## 2019-01-29 DIAGNOSIS — N852 Hypertrophy of uterus: Secondary | ICD-10-CM | POA: Diagnosis not present

## 2019-03-12 DIAGNOSIS — Z7983 Long term (current) use of bisphosphonates: Secondary | ICD-10-CM | POA: Diagnosis not present

## 2019-03-12 DIAGNOSIS — D259 Leiomyoma of uterus, unspecified: Secondary | ICD-10-CM | POA: Diagnosis not present

## 2019-03-12 DIAGNOSIS — N852 Hypertrophy of uterus: Secondary | ICD-10-CM | POA: Diagnosis not present

## 2019-04-01 ENCOUNTER — Other Ambulatory Visit: Payer: Self-pay | Admitting: Internal Medicine

## 2019-04-06 ENCOUNTER — Other Ambulatory Visit: Payer: Self-pay | Admitting: Internal Medicine

## 2019-04-06 NOTE — Telephone Encounter (Signed)
Medication: omeprazole (PRILOSEC) 40 MG capsule [id number]   Has the patient contacted their pharmacy? Yes  (Agent: If no, request that the patient contact the pharmacy for the refill.) (Agent: If yes, when and what did the pharmacy advise?)  Preferred Pharmacy (with phone number or street name): [facility] [phone] - [place], [address] AT [facility] [address] [place] [phone] (Phone) [phone] (Fax    Agent: Please be advised that RX refills may take up to 3 business days. We ask that you follow-up with your pharmacy.

## 2019-04-15 ENCOUNTER — Other Ambulatory Visit (INDEPENDENT_AMBULATORY_CARE_PROVIDER_SITE_OTHER): Payer: Medicare HMO

## 2019-04-15 ENCOUNTER — Encounter: Payer: Self-pay | Admitting: Internal Medicine

## 2019-04-15 ENCOUNTER — Other Ambulatory Visit: Payer: Self-pay

## 2019-04-15 ENCOUNTER — Ambulatory Visit (INDEPENDENT_AMBULATORY_CARE_PROVIDER_SITE_OTHER): Payer: Medicare HMO | Admitting: Internal Medicine

## 2019-04-15 VITALS — BP 120/76 | HR 56 | Temp 97.6°F | Ht 68.0 in | Wt 207.0 lb

## 2019-04-15 DIAGNOSIS — I10 Essential (primary) hypertension: Secondary | ICD-10-CM

## 2019-04-15 DIAGNOSIS — R0989 Other specified symptoms and signs involving the circulatory and respiratory systems: Secondary | ICD-10-CM

## 2019-04-15 DIAGNOSIS — R7309 Other abnormal glucose: Secondary | ICD-10-CM

## 2019-04-15 DIAGNOSIS — Z0000 Encounter for general adult medical examination without abnormal findings: Secondary | ICD-10-CM

## 2019-04-15 LAB — URINALYSIS, ROUTINE W REFLEX MICROSCOPIC
Bilirubin Urine: NEGATIVE
Hgb urine dipstick: NEGATIVE
Ketones, ur: NEGATIVE
Nitrite: POSITIVE — AB
Specific Gravity, Urine: 1.015 (ref 1.000–1.030)
Total Protein, Urine: NEGATIVE
Urine Glucose: NEGATIVE
Urobilinogen, UA: 0.2 (ref 0.0–1.0)
pH: 7 (ref 5.0–8.0)

## 2019-04-15 LAB — LIPID PANEL
Cholesterol: 85 mg/dL (ref 0–200)
HDL: 48.2 mg/dL (ref >39.00)
LDL Cholesterol: 21 mg/dL (ref 0–99)
NonHDL: 36.91
Total CHOL/HDL Ratio: 2
Triglycerides: 81 mg/dL (ref 0.0–149.0)
VLDL: 16.2 mg/dL (ref 0.0–40.0)

## 2019-04-15 LAB — CBC WITH DIFFERENTIAL/PLATELET
Basophils Absolute: 0 10*3/uL (ref 0.0–0.1)
Basophils Relative: 0.6 % (ref 0.0–3.0)
Eosinophils Absolute: 0.1 10*3/uL (ref 0.0–0.7)
Eosinophils Relative: 1.6 % (ref 0.0–5.0)
HCT: 34.9 % — ABNORMAL LOW (ref 36.0–46.0)
Hemoglobin: 11.5 g/dL — ABNORMAL LOW (ref 12.0–15.0)
Lymphocytes Relative: 23.8 % (ref 12.0–46.0)
Lymphs Abs: 2.1 10*3/uL (ref 0.7–4.0)
MCHC: 33.1 g/dL (ref 30.0–36.0)
MCV: 88.7 fl (ref 78.0–100.0)
Monocytes Absolute: 0.5 10*3/uL (ref 0.1–1.0)
Monocytes Relative: 6.1 % (ref 3.0–12.0)
Neutro Abs: 5.9 10*3/uL (ref 1.4–7.7)
Neutrophils Relative %: 67.9 % (ref 43.0–77.0)
Platelets: 289 10*3/uL (ref 150.0–400.0)
RBC: 3.93 Mil/uL (ref 3.87–5.11)
RDW: 14.3 % (ref 11.5–15.5)
WBC: 8.6 10*3/uL (ref 4.0–10.5)

## 2019-04-15 LAB — BASIC METABOLIC PANEL
BUN: 17 mg/dL (ref 6–23)
CO2: 29 mEq/L (ref 19–32)
Calcium: 9.3 mg/dL (ref 8.4–10.5)
Chloride: 103 mEq/L (ref 96–112)
Creatinine, Ser: 1.01 mg/dL (ref 0.40–1.20)
GFR: 66.24 mL/min (ref >60.00)
Glucose, Bld: 103 mg/dL — ABNORMAL HIGH (ref 70–99)
Potassium: 3.8 mEq/L (ref 3.5–5.1)
Sodium: 139 mEq/L (ref 135–145)

## 2019-04-15 LAB — HEPATIC FUNCTION PANEL
ALT: 11 U/L (ref 0–35)
AST: 15 U/L (ref 0–37)
Albumin: 4 g/dL (ref 3.5–5.2)
Alkaline Phosphatase: 82 U/L (ref 39–117)
Bilirubin, Direct: 0.1 mg/dL (ref 0.0–0.3)
Total Bilirubin: 0.4 mg/dL (ref 0.2–1.2)
Total Protein: 7.8 g/dL (ref 6.0–8.3)

## 2019-04-15 LAB — HEMOGLOBIN A1C: Hgb A1c MFr Bld: 6.1 % (ref 4.6–6.5)

## 2019-04-15 LAB — TSH: TSH: 2.86 u[IU]/mL (ref 0.35–4.50)

## 2019-04-15 MED ORDER — AMLODIPINE BESYLATE 10 MG PO TABS: ORAL_TABLET | ORAL | 3 refills | Status: DC

## 2019-04-15 MED ORDER — CARVEDILOL 25 MG PO TABS: 25.0000 mg | ORAL_TABLET | Freq: Two times a day (BID) | ORAL | 3 refills | Status: DC

## 2019-04-15 MED ORDER — OMEPRAZOLE 40 MG PO CPDR: DELAYED_RELEASE_CAPSULE | ORAL | 3 refills | Status: DC

## 2019-04-15 NOTE — Progress Notes (Signed)
Subjective:  Patient ID: Molly Newman, female    DOB: 07/13/1950  Age: 68 y.o. MRN: [id number]  CC: No chief complaint on file.   HPI DELENN FETTING presents for a well exam F/u HTN C/o her 29 yo sister just had a bad stroke  Outpatient Medications Prior to Visit  Medication Sig Dispense Refill   amLODipine (NORVASC) 10 MG tablet TAKE 1 TABLET(10 MG) BY MOUTH DAILY 30 tablet 2   carvedilol (COREG) 25 MG tablet Take 1 tablet (25 mg total) by mouth 2 (two) times daily with a meal. 60 tablet 11   Cholecalciferol (VITAMIN D3) 1000 UNITS tablet Take 1,000 Units by mouth daily.       fexofenadine-pseudoephedrine (ALLEGRA-D 24) 180-240 MG per 24 hr tablet Take 1 tablet by mouth daily.       omeprazole (PRILOSEC) 40 MG capsule TAKE 1 CAPSULE(40 MG) BY MOUTH DAILY 90 capsule 3   tolterodine (DETROL LA) 4 MG 24 hr capsule Take 4 mg by mouth as needed.     Facility-Administered Medications Prior to Visit  Medication Dose Route Frequency Provider Last Rate Last Dose   0.9 %  sodium chloride infusion  500 mL Intravenous Once Pyrtle, Lajuan Lines, MD        ROS: Review of Systems  Constitutional: Negative for activity change, appetite change, chills, fatigue and unexpected weight change.  HENT: Negative for congestion, mouth sores and sinus pressure.   Eyes: Negative for visual disturbance.  Respiratory: Negative for cough and chest tightness.   Gastrointestinal: Negative for abdominal pain and nausea.  Genitourinary: Negative for difficulty urinating, frequency and vaginal pain.  Musculoskeletal: Negative for back pain and gait problem.  Skin: Negative for pallor and rash.  Neurological: Negative for dizziness, tremors, weakness, numbness and headaches.  Psychiatric/Behavioral: Negative for confusion, sleep disturbance and suicidal ideas.    Objective:  BP 120/76 (BP Location: Left Arm, Patient Position: Sitting, Cuff Size: Large)    Pulse (!) 56    Temp 97.6 F (36.4 C)  (Oral)    Ht 5\' 8"  (1.727 m)    Wt 207 lb (93.9 kg)    SpO2 96%    BMI 31.47 kg/m   BP Readings from Last 3 Encounters:  04/15/19 120/76  01/21/18 118/76  08/19/17 114/78    Wt Readings from Last 3 Encounters:  04/15/19 207 lb (93.9 kg)  01/21/18 209 lb (94.8 kg)  08/19/17 203 lb (92.1 kg)    Physical Exam Constitutional:      General: She is not in acute distress.    Appearance: She is well-developed. She is obese.  HENT:     Head: Normocephalic.     Right Ear: External ear normal.     Left Ear: External ear normal.     Nose: Nose normal.  Eyes:     General:        Right eye: No discharge.        Left eye: No discharge.     Conjunctiva/sclera: Conjunctivae normal.     Pupils: Pupils are equal, round, and reactive to light.  Neck:     Musculoskeletal: Normal range of motion and neck supple.     Thyroid: No thyromegaly.     Vascular: No JVD.     Trachea: No tracheal deviation.  Cardiovascular:     Rate and Rhythm: Normal rate and regular rhythm.     Heart sounds: Normal heart sounds.  Pulmonary:     Effort: No respiratory distress.  Breath sounds: No stridor. No wheezing.  Abdominal:     General: Bowel sounds are normal. There is no distension.     Palpations: Abdomen is soft. There is no mass.     Tenderness: There is no abdominal tenderness. There is no guarding or rebound.  Musculoskeletal:        General: No tenderness.  Lymphadenopathy:     Cervical: No cervical adenopathy.  Skin:    Findings: No erythema or rash.  Neurological:     Cranial Nerves: No cranial nerve deficit.     Motor: No abnormal muscle tone.     Coordination: Coordination normal.     Deep Tendon Reflexes: Reflexes normal.  Psychiatric:        Behavior: Behavior normal.        Thought Content: Thought content normal.        Judgment: Judgment normal.   mild B bruit  Procedure: EKG Indication: HTN, palpitations Impression: S brady. No acute changes.   Lab Results  Component  Value Date   WBC 7.4 01/21/2018   HGB 11.2 (L) 01/21/2018   HCT 34.3 (L) 01/21/2018   PLT 266.0 01/21/2018   GLUCOSE 90 01/21/2018   CHOL 86 01/21/2018   TRIG 77.0 01/21/2018   HDL 43.90 01/21/2018   LDLCALC 27 01/21/2018   ALT 10 01/21/2018   AST 13 01/21/2018   NA 137 01/21/2018   K 3.9 01/21/2018   CL 103 01/21/2018   CREATININE 1.05 01/21/2018   BUN 18 01/21/2018   CO2 26 01/21/2018   TSH 2.61 01/21/2018   HGBA1C 6.3 01/21/2018    US Thyroid  Result Date: 02/06/2017 CLINICAL DATA:  Goiter. EXAM: THYROID ULTRASOUND TECHNIQUE: Ultrasound examination of the thyroid gland and adjacent soft tissues was performed. COMPARISON:  01/20/2014 FINDINGS: Parenchymal Echotexture: Mildly heterogenous Isthmus: 0.5 cm, previously 0.4 cm Right lobe: 4.8 x 1.6 x 2.5 cm, previously 4.7 x 1.6 x 2.1 cm Left lobe: 5.5 x 1.7 x 1.9 cm, previously 5.1 x 1.7 x 2.1 cm _________________________________________________________ Estimated total number of nodules >/= 1 cm: 2 Number of spongiform nodules >/=  2 cm not described below (TR1): 0 Number of mixed cystic and solid nodules >/= 1.5 cm not described below ([place]): 0 _________________________________________________________ Nodule versus pseudo nodule # 1: Location: Right; Inferior Maximum size: 1.1 cm; Other 2 dimensions: 1.0 x 1.0 cm Composition: solid/almost completely solid (2) Echogenicity: hypoechoic (2) Shape: not taller-than-wide (0) Margins: ill-defined (0) Echogenic foci: none (0) ACR TI-RADS total points: 4. ACR TI-RADS risk category: TR4 (4-6 points). ACR TI-RADS recommendations: *Given size (>/= 1 - 1.4 cm) and appearance, a follow-up ultrasound in 1 year should be considered based on TI-RADS criteria. _________________________________________________________ Benign appearing cysts are seen bilaterally. Other smaller nodules are scattered throughout both lobes which do not meet criteria for biopsy or follow-up. IMPRESSION: Right lower pole nodule 1 meets  criteria for annual follow-up. Other nodules are either benign cystic lesions or do not meet criteria for follow-up or biopsy based on size. The above is in keeping with the ACR TI-RADS recommendations - J Am Coll Radiol 2017;14:587-595. Electronically Signed   By: Marybelle Killings M.D.   On: 02/06/2017 14:41    Assessment & Plan:   There are no diagnoses linked to this encounter.   No orders of the defined types were placed in this encounter.    Follow-up: No follow-ups on file.  Walker Kehr, MD

## 2019-04-15 NOTE — Assessment & Plan Note (Signed)
A cardiac CT scan for calcium scoring offered 

## 2019-04-15 NOTE — Patient Instructions (Signed)

## 2019-04-15 NOTE — Assessment & Plan Note (Signed)
Labs

## 2019-04-15 NOTE — Assessment & Plan Note (Addendum)
We discussed age appropriate health related issues, including available/recomended screening tests and vaccinations. We discussed a need for adhering to healthy diet and exercise. Labs ordered. All questions were answered. Refused vaccines Caring for a 66 yo mother Colon due 2022 - polyps (Dr Hilarie Fredrickson) Eye exam q 12 mo Mammo, PAP A  cardiac CT scan for calcium scoring  Offered 12/20

## 2019-04-15 NOTE — Assessment & Plan Note (Signed)
Mild Sister w/CVA Duplex US B carotids

## 2019-04-16 ENCOUNTER — Other Ambulatory Visit: Payer: Self-pay | Admitting: Internal Medicine

## 2019-04-16 MED ORDER — CEPHALEXIN 500 MG PO CAPS: 500.0000 mg | ORAL_CAPSULE | Freq: Three times a day (TID) | ORAL | 0 refills | Status: DC

## 2019-04-20 ENCOUNTER — Other Ambulatory Visit: Payer: Self-pay | Admitting: Internal Medicine

## 2019-04-29 NOTE — Addendum Note (Signed)
Addended by: Aviva Signs M on: 04/29/2019 12:53 PM   Modules accepted: Orders

## 2019-05-13 ENCOUNTER — Ambulatory Visit (HOSPITAL_COMMUNITY)
Admission: RE | Admit: 2019-05-13 | Discharge: 2019-05-13 | Disposition: A | Discharge status: Home / Self Care | Payer: Medicare HMO | Source: Ambulatory Visit | Attending: Cardiology | Admitting: Cardiology

## 2019-05-13 ENCOUNTER — Other Ambulatory Visit: Payer: Self-pay

## 2019-05-13 DIAGNOSIS — R0989 Other specified symptoms and signs involving the circulatory and respiratory systems: Secondary | ICD-10-CM | POA: Diagnosis not present

## 2019-07-09 ENCOUNTER — Ambulatory Visit: Payer: Medicare HMO | Attending: Internal Medicine

## 2019-07-09 DIAGNOSIS — Z23 Encounter for immunization: Secondary | ICD-10-CM | POA: Insufficient documentation

## 2019-07-09 NOTE — Progress Notes (Signed)
   Covid-19 Vaccination Clinic  Name:  Molly Newman    MRN: [id number] DOB: 1951-05-02  07/09/2019  [name] was observed post Covid-19 immunization for 15 minutes without incident. She was provided with Vaccine Information Sheet and instruction to access the V-Safe system.   [name] was instructed to call 911 with any severe reactions post vaccine: Marland Kitchen Difficulty breathing  . Swelling of face and throat  . A fast heartbeat  . A bad rash all over body  . Dizziness and weakness   Immunizations Administered    Name Date Dose VIS Date Route   Pfizer COVID-19 Vaccine 07/09/2019  8:54 AM 0.3 mL 04/17/2019 Intramuscular   Manufacturer: [place]   Lot: [id number]   [place]: [id number]

## 2019-08-05 ENCOUNTER — Ambulatory Visit: Payer: Medicare HMO | Attending: Internal Medicine

## 2019-08-05 DIAGNOSIS — Z23 Encounter for immunization: Secondary | ICD-10-CM

## 2019-08-05 NOTE — Progress Notes (Signed)
   Covid-19 Vaccination Clinic  Name:  ALECIA VANDERWOUDE    MRN: [id number] DOB: June 05, 1950  08/05/2019  Ms. Horace was observed post Covid-19 immunization for 15 minutes without incident. She was provided with Vaccine Information Sheet and instruction to access the V-Safe system.   Ms. Dunlevy was instructed to call 911 with any severe reactions post vaccine: Marland Kitchen Difficulty breathing  . Swelling of face and throat  . A fast heartbeat  . A bad rash all over body  . Dizziness and weakness   Immunizations Administered    Name Date Dose VIS Date Route   Pfizer COVID-19 Vaccine 08/05/2019 10:35 AM 0.3 mL 04/17/2019 Intramuscular   Manufacturer: [employer]   Lot: [id number]   [place]: [id number]

## 2019-09-10 IMAGING — US US THYROID
1 series · 13 of 25 positions shown · non-contrast
Comparison: 01/20/2014

CLINICAL DATA: Goiter.

EXAM:
THYROID ULTRASOUND
TECHNIQUE: Ultrasound examination of the thyroid gland and adjacent soft
tissues was performed.

[Series 1: us thyroid · 0.08mm/px · 13 of 64 slices shown]
[im 1/64]
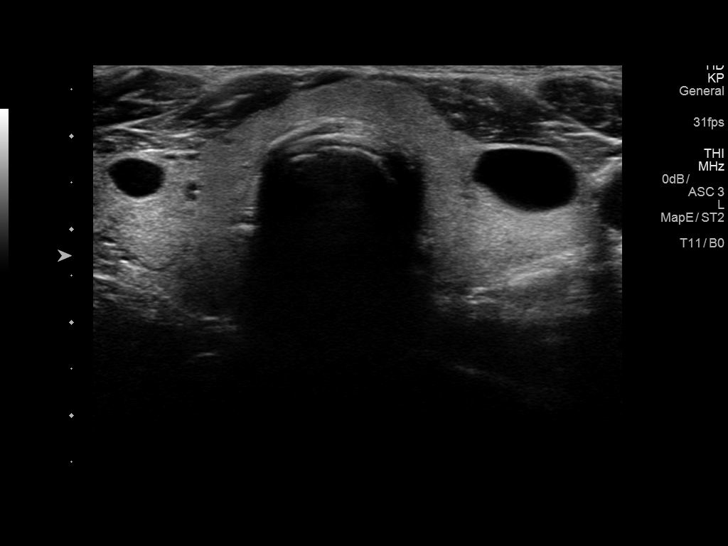
[im 6/64]
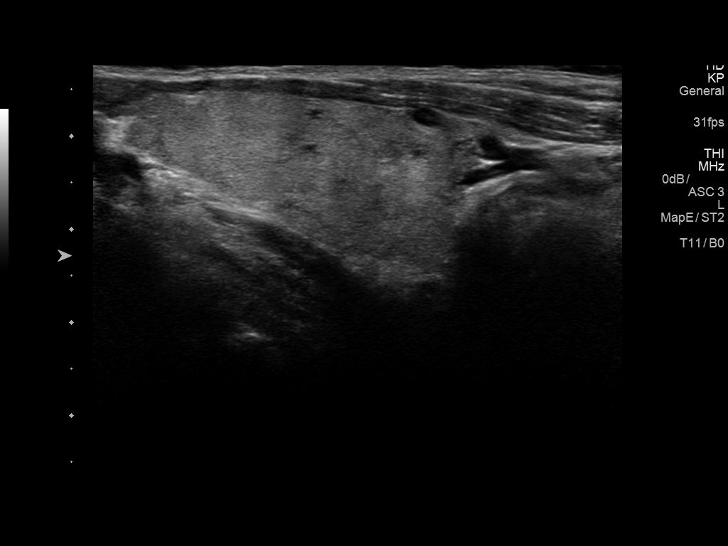
[im 11/64]
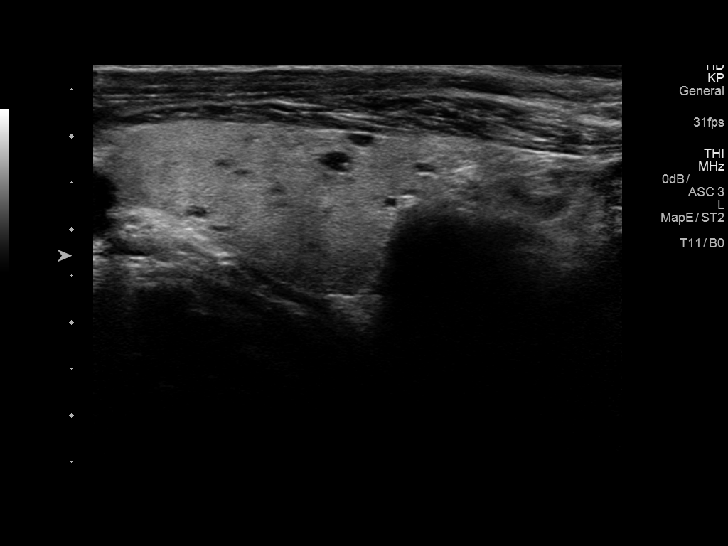
[im 16/64]
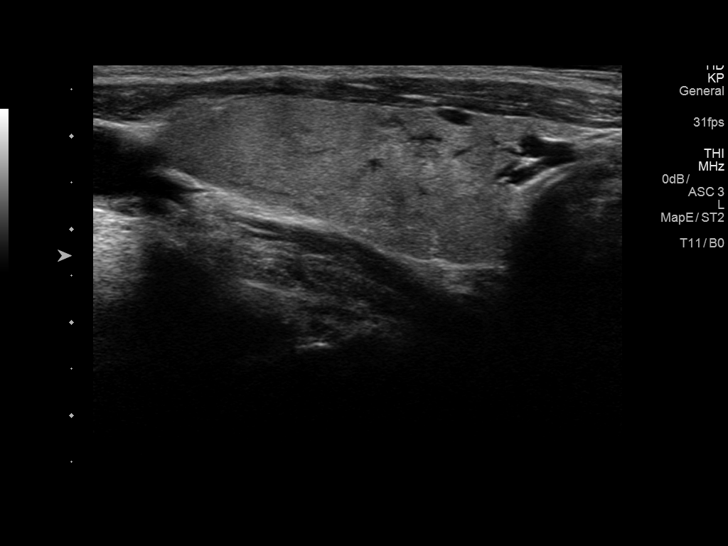
[im 22/64]
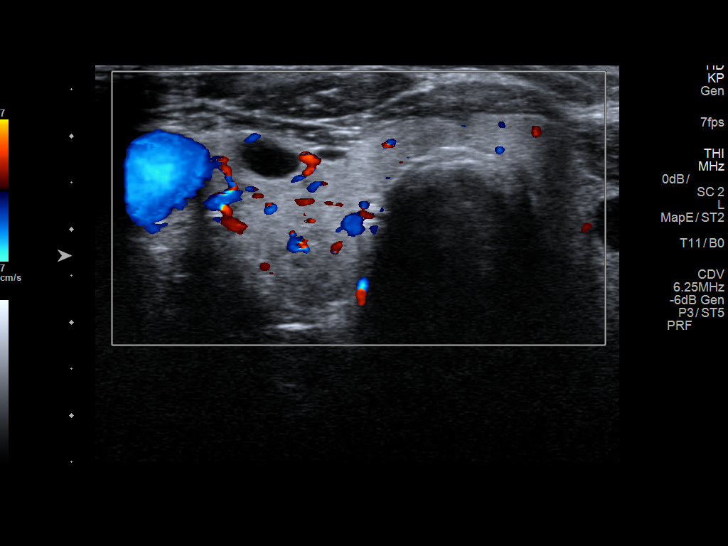
[im 27/64]
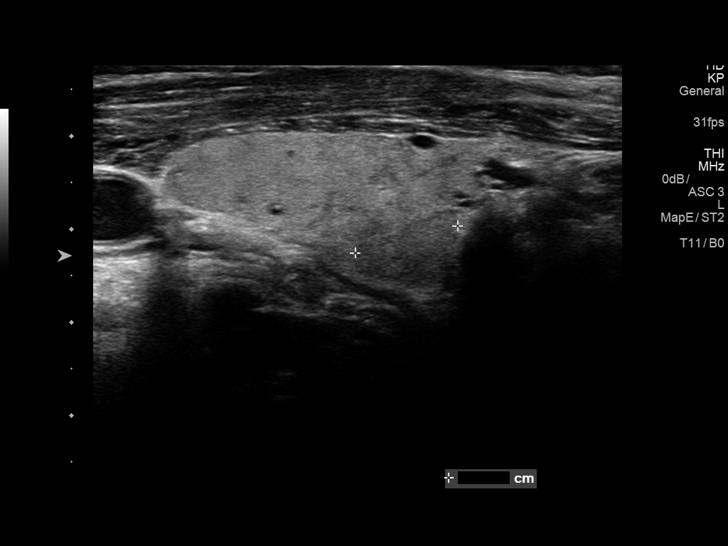
[im 32/64]
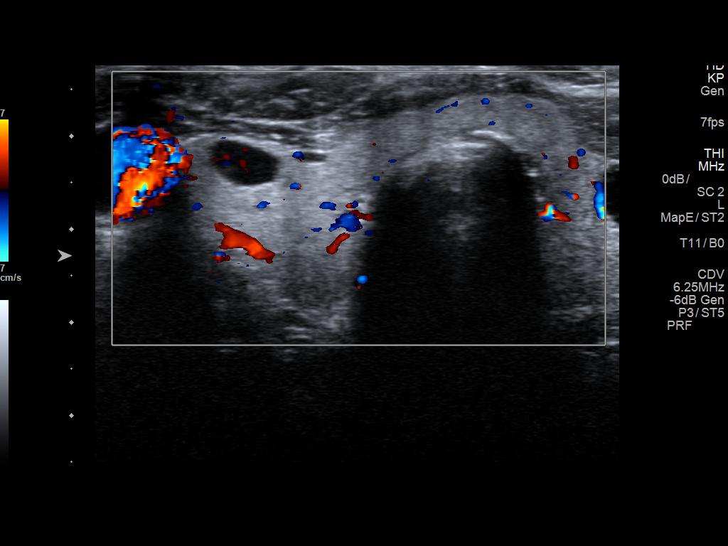
[im 37/64]
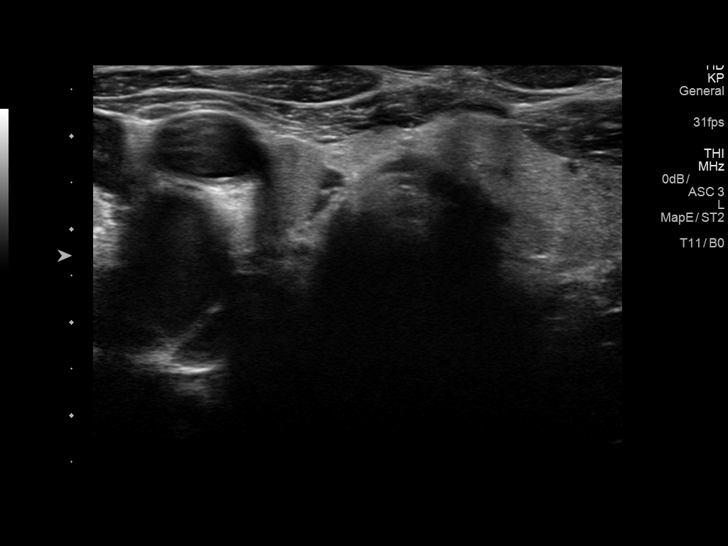
[im 43/64]
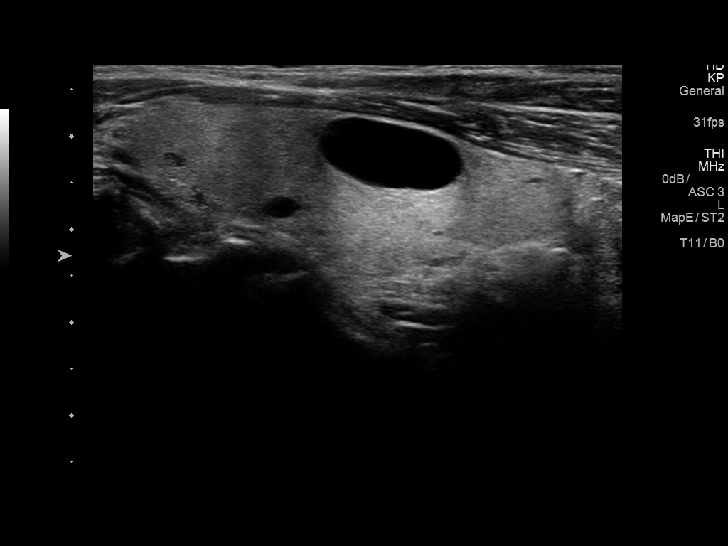
[im 48/64]
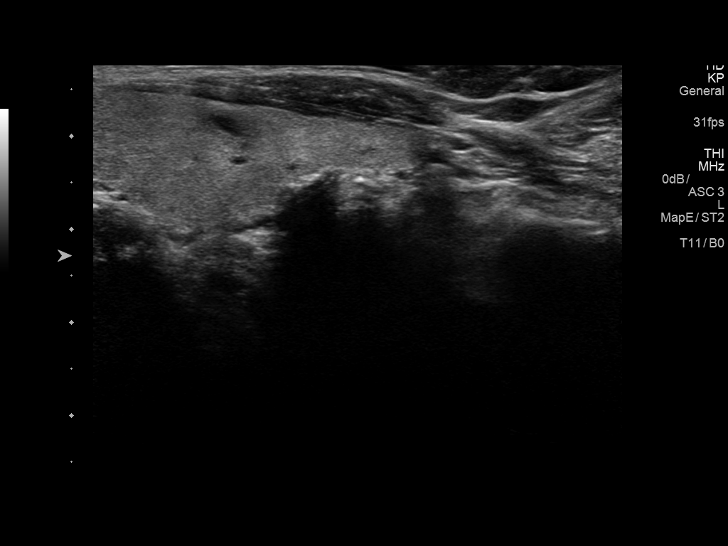
[im 53/64]
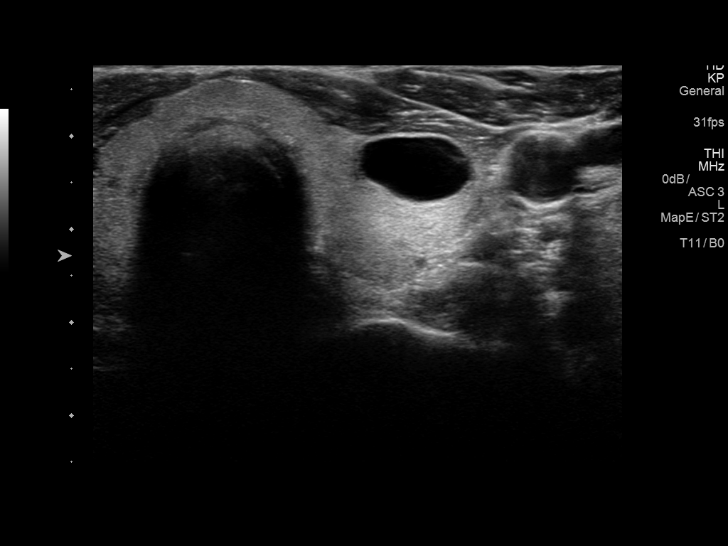
[im 58/64]
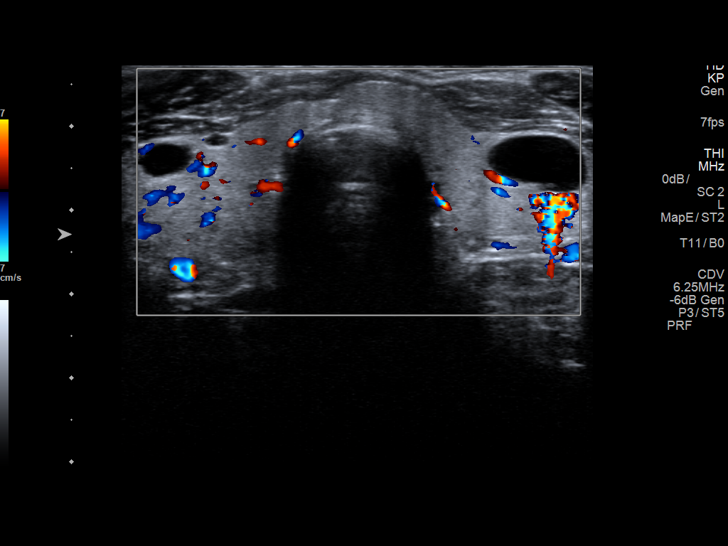
[im 64/64]
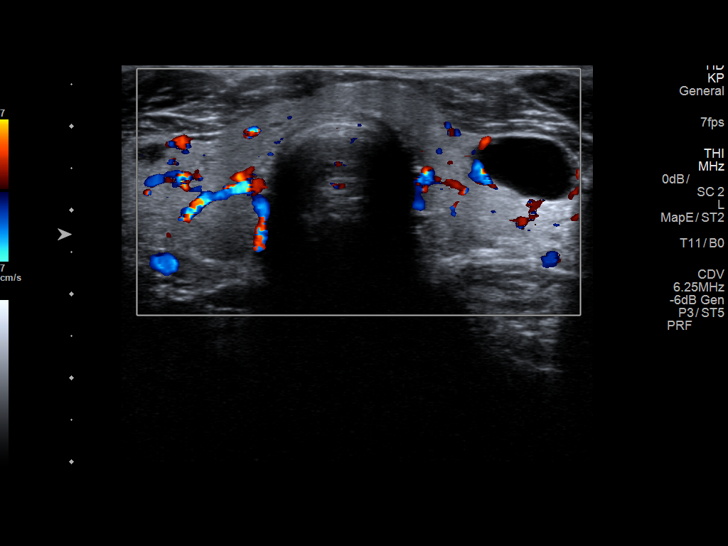

[13 of 25 positions shown; findings below may reference images not displayed]

FINDINGS: Parenchymal Echotexture: Mildly heterogenous

Isthmus: 0.5 cm, previously 0.4 cm

Right lobe: 4.8 x 1.6 x 2.5 cm, previously 4.7 x 1.6 x 2.1 cm

Left lobe: 5.5 x 1.7 x 1.9 cm, previously 5.1 x 1.7 x 2.1 cm

_________________________________________________________

Estimated total number of nodules >/= 1 cm: 2

Number of spongiform nodules >/=  2 cm not described below (TR1): 0

Number of mixed cystic and solid nodules >/= 1.5 cm not described
below (TR2): 0

_________________________________________________________

Nodule versus pseudo nodule # 1:

Location: Right; Inferior

Maximum size: 1.1 cm; Other 2 dimensions: 1.0 x 1.0 cm

Composition: solid/almost completely solid (2)

Echogenicity: hypoechoic (2)

Shape: not taller-than-wide (0)

Margins: ill-defined (0)

Echogenic foci: none (0)

ACR TI-RADS total points: 4.

ACR TI-RADS risk category: TR4 (4-6 points).

ACR TI-RADS recommendations:

*Given size (>/= 1 - 1.4 cm) and appearance, a follow-up ultrasound
in 1 year should be considered based on TI-RADS criteria.

_________________________________________________________

Benign appearing cysts are seen bilaterally. Other smaller nodules
are scattered throughout both lobes which do not meet criteria for
biopsy or follow-up.
IMPRESSION: Right lower pole nodule 1 meets criteria for annual follow-up.

Other nodules are either benign cystic lesions or do not meet
criteria for follow-up or biopsy based on size.

The above is in keeping with the ACR TI-RADS recommendations - [HOSPITAL] 2766;[DATE].

## 2019-11-02 ENCOUNTER — Other Ambulatory Visit: Payer: Self-pay

## 2019-11-02 ENCOUNTER — Encounter: Payer: Self-pay | Admitting: Family

## 2019-11-02 ENCOUNTER — Other Ambulatory Visit: Payer: Self-pay | Admitting: Family

## 2019-11-02 ENCOUNTER — Ambulatory Visit (INDEPENDENT_AMBULATORY_CARE_PROVIDER_SITE_OTHER): Payer: Medicare HMO | Admitting: Family

## 2019-11-02 VITALS — BP 118/70 | HR 61 | Temp 98.1°F | Ht 68.0 in | Wt 202.0 lb

## 2019-11-02 DIAGNOSIS — R600 Localized edema: Secondary | ICD-10-CM | POA: Diagnosis not present

## 2019-11-02 DIAGNOSIS — R300 Dysuria: Secondary | ICD-10-CM

## 2019-11-02 LAB — COMPREHENSIVE METABOLIC PANEL
ALT: 12 U/L (ref 0–35)
AST: 14 U/L (ref 0–37)
Albumin: 4.1 g/dL (ref 3.5–5.2)
Alkaline Phosphatase: 73 U/L (ref 39–117)
BUN: 12 mg/dL (ref 6–23)
CO2: 25 mEq/L (ref 19–32)
Calcium: 9.3 mg/dL (ref 8.4–10.5)
Chloride: 103 mEq/L (ref 96–112)
Creatinine, Ser: 1.24 mg/dL — ABNORMAL HIGH (ref 0.40–1.20)
GFR: 52.19 mL/min — ABNORMAL LOW (ref >60.00)
Glucose, Bld: 94 mg/dL (ref 70–99)
Potassium: 3.8 mEq/L (ref 3.5–5.1)
Sodium: 136 mEq/L (ref 135–145)
Total Bilirubin: 0.4 mg/dL (ref 0.2–1.2)
Total Protein: 7.7 g/dL (ref 6.0–8.3)

## 2019-11-02 LAB — CBC WITH DIFFERENTIAL/PLATELET
Basophils Absolute: 0 10*3/uL (ref 0.0–0.1)
Basophils Relative: 0.6 % (ref 0.0–3.0)
Eosinophils Absolute: 0.2 10*3/uL (ref 0.0–0.7)
Eosinophils Relative: 2.3 % (ref 0.0–5.0)
HCT: 33.9 % — ABNORMAL LOW (ref 36.0–46.0)
Hemoglobin: 11.2 g/dL — ABNORMAL LOW (ref 12.0–15.0)
Lymphocytes Relative: 30.1 % (ref 12.0–46.0)
Lymphs Abs: 2.3 10*3/uL (ref 0.7–4.0)
MCHC: 33 g/dL (ref 30.0–36.0)
MCV: 88 fl (ref 78.0–100.0)
Monocytes Absolute: 0.5 10*3/uL (ref 0.1–1.0)
Monocytes Relative: 7.1 % (ref 3.0–12.0)
Neutro Abs: 4.5 10*3/uL (ref 1.4–7.7)
Neutrophils Relative %: 59.9 % (ref 43.0–77.0)
Platelets: 258 10*3/uL (ref 150.0–400.0)
RBC: 3.85 Mil/uL — ABNORMAL LOW (ref 3.87–5.11)
RDW: 14.5 % (ref 11.5–15.5)
WBC: 7.6 10*3/uL (ref 4.0–10.5)

## 2019-11-02 LAB — BRAIN NATRIURETIC PEPTIDE: Pro B Natriuretic peptide (BNP): 39 pg/mL (ref 0.0–100.0)

## 2019-11-02 MED ORDER — FUROSEMIDE 20 MG PO TABS
20.0000 mg | ORAL_TABLET | Freq: Every day | ORAL | 0 refills | Status: DC | PRN
Start: 2019-11-02 — End: 2019-11-02

## 2019-11-02 NOTE — Progress Notes (Signed)
KOSISOCHUKWU GOLDBERG is a 69 y.o. female with the following history as recorded in EpicCare:  Patient Active Problem List   Diagnosis Date Noted   Bilateral carotid bruits 04/15/2019   Rash 06/27/2017   Midsternal chest pain 01/02/2016   Well adult exam 11/16/2011   Dysphagia 11/16/2011   Goiter 11/16/2011   Cough 09/08/2010   Upper respiratory infection 09/03/2010   WEIGHT GAIN, ABNORMAL 06/07/2010   LARYNGITIS, ACUTE 05/11/2009   ABNORMAL GLUCOSE NEC 03/24/2008   Essential hypertension 03/27/2007   ASTHMATIC BRONCHITIS, ACUTE 03/27/2007   Allergic rhinitis 03/27/2007    Current Outpatient Medications  Medication Sig Dispense Refill   amLODipine (NORVASC) 10 MG tablet TAKE 1 TABLET(10 MG) BY MOUTH DAILY 30 tablet 11   carvedilol (COREG) 25 MG tablet Take 1 tablet (25 mg total) by mouth 2 (two) times daily with a meal. 180 tablet 3   Cholecalciferol (VITAMIN D3) 1000 UNITS tablet Take 1,000 Units by mouth daily.       fexofenadine-pseudoephedrine (ALLEGRA-D 24) 180-240 MG per 24 hr tablet Take 1 tablet by mouth daily.       omeprazole (PRILOSEC) 40 MG capsule TAKE 1 CAPSULE(40 MG) BY MOUTH DAILY 90 capsule 3   tolterodine (DETROL LA) 4 MG 24 hr capsule Take 4 mg by mouth as needed.     furosemide (LASIX) 20 MG tablet Take 1 tablet (20 mg total) by mouth daily as needed for edema. 30 tablet 0   No current facility-administered medications for this visit.    Allergies: Ace inhibitors and Diltiazem hcl  Past Medical History:  Diagnosis Date   Allergy    Cancer ([place])    30 years ago   GERD (gastroesophageal reflux disease)    Heart murmur    as a child   Hypertension     History reviewed. No pertinent surgical history.  Family History  Problem Relation Age of Onset   Hypertension Other    Colon polyps Brother    Stroke Sister    Colon cancer Neg Hx    Esophageal cancer Neg Hx    Rectal cancer Neg Hx    Stomach cancer Neg Hx     Social  History   Tobacco Use   Smoking status: Never Smoker   Smokeless tobacco: Never Used  Substance Use Topics   Alcohol use: Yes    Comment: occasionally    Subjective:  Bilateral foot swelling x at least one week; notes that her feel "just feel tight." Admits she has been eating too much salt/ drinking too many sodas; no chest pain or shortness of breath; no pain in either calf or swelling in either calf; no recent travel; has been on Amlodipine "for years" with no swelling issues;   Objective:  Vitals:   11/02/19 1111  BP: 118/70  Pulse: 61  Temp: 98.1 F (36.7 C)  TempSrc: Oral  SpO2: 97%  Weight: 202 lb (91.6 kg)  Height: 5' 8"  (1.727 m)    General: Well developed, well nourished, in no acute distress  Skin : Warm and dry.  Head: Normocephalic and atraumatic  Lungs: Respirations unlabored; clear to auscultation bilaterally without wheeze, rales, rhonchi  CVS exam: normal rate and regular rhythm.  Extremities: + pedal edema noted across tops of both feet, cyanosis, clubbing  Vessels: Symmetric bilaterally  Neurologic: Alert and oriented; speech intact; face symmetrical; moves all extremities well; CNII-XII intact without focal deficit   Assessment:  1. Pedal edema   2. Dysuria  Plan:  1. Suspect secondary to too much salt intake; update labs today; trial of Lasix 20 mg prn for swelling; limit salt and sodas; follow-up to be determined; 2. Check urine culture to ensure no infection;  This visit occurred during the SARS-CoV-2 public health emergency.  Safety protocols were in place, including screening questions prior to the visit, additional usage of staff PPE, and extensive cleaning of exam room while observing appropriate contact time as indicated for disinfecting solutions.      No follow-ups on file.  Orders Placed This Encounter  Procedures   Urine Culture   CBC with Differential/Platelet   Comp Met (CMET)   B Nat Peptide    Requested Prescriptions    Signed Prescriptions Disp Refills   furosemide (LASIX) 20 MG tablet 30 tablet 0    Sig: Take 1 tablet (20 mg total) by mouth daily as needed for edema.

## 2019-11-03 ENCOUNTER — Telehealth: Payer: Self-pay | Admitting: *Deleted

## 2019-11-03 NOTE — Telephone Encounter (Signed)
Called pt concerning lab results. Pt states she woke up this am and her (R) side of her jaw was swollen. She states she used to have a problem w/ her body swelling years ago and Dr. Alain Marion used to giver her prednisone shot. The swelling in her feet has gone down a little. Pt is asking if she need to double up on the medication that was rx since she woke up this am w/swelling. Inform pt to take med as rx by laura, and I will send a msg, and give her call bck w/Laura response. Made f/u appt w/Dr. Camila Li 11/23/19.Marland KitchenJohny Chess

## 2019-11-04 NOTE — Telephone Encounter (Signed)
She would need to be seen; this is very different than what she and I discussed.

## 2019-11-04 NOTE — Telephone Encounter (Signed)
Called pt there was no answer LMOM w/LaURA RESPONSE.Marland Kitchen/LMB

## 2019-11-05 NOTE — Telephone Encounter (Signed)
Error

## 2019-11-06 ENCOUNTER — Encounter: Payer: Self-pay | Admitting: Family

## 2019-11-06 ENCOUNTER — Ambulatory Visit (INDEPENDENT_AMBULATORY_CARE_PROVIDER_SITE_OTHER): Payer: Medicare HMO | Admitting: Family

## 2019-11-06 ENCOUNTER — Other Ambulatory Visit: Payer: Self-pay

## 2019-11-06 VITALS — BP 128/70 | HR 74 | Temp 98.3°F | Ht 68.0 in | Wt 207.2 lb

## 2019-11-06 DIAGNOSIS — K047 Periapical abscess without sinus: Secondary | ICD-10-CM | POA: Diagnosis not present

## 2019-11-06 MED ORDER — AMOXICILLIN-POT CLAVULANATE 875-125 MG PO TABS: 1.0000 | ORAL_TABLET | Freq: Two times a day (BID) | ORAL | 0 refills | Status: AC

## 2019-11-06 MED ORDER — HYDROCODONE-ACETAMINOPHEN 5-325 MG PO TABS: 1.0000 | ORAL_TABLET | Freq: Four times a day (QID) | ORAL | 0 refills | Status: DC | PRN

## 2019-11-06 NOTE — Patient Instructions (Signed)
Dental Abscess  A dental abscess is an area of pus in or around a tooth. It comes from an infection. It can cause pain and other symptoms. Treatment will help with symptoms and prevent the infection from spreading. Follow these instructions at home: Medicines  Take over-the-counter and prescription medicines only as told by your dentist.  If you were prescribed an antibiotic medicine, take it as told by your dentist. Do not stop taking it even if you start to feel better.  If you were prescribed a gel that has numbing medicine in it, use it exactly as told.  Do not drive or use heavy machinery (like a lawn mower) while taking prescription pain medicine. General instructions  Rinse out your mouth often with salt water. ? To make salt water, dissolve -1 tsp of salt in 1 cup of warm water.  Eat a soft diet while your mouth is healing.  Drink enough fluid to keep your urine pale yellow.  Do not apply heat to the outside of your mouth.  Do not use any products that contain nicotine or tobacco. These include cigarettes and e-cigarettes. If you need help quitting, ask your doctor.  Keep all follow-up visits as told by your dentist. This is important. Prevent an abscess  Brush your teeth every morning and every night. Use fluoride toothpaste.  Floss your teeth each day.  Get dental cleanings as often as told by your dentist.  Think about getting dental sealant put on teeth that have deep holes (decay).  Drink water that has fluoride in it. ? Most tap water has fluoride. ? Check the label on bottled water to see if it has fluoride in it.  Drink water instead of sugary drinks.  Eat healthy meals and snacks.  Wear a mouth guard or face shield when you play sports. Contact a doctor if:  Your pain is worse, and medicine does not help. Get help right away if:  You have a fever or chills.  Your symptoms suddenly get worse.  You have a very bad headache.  You have problems  breathing or swallowing.  You have trouble opening your mouth.  You have swelling in your neck or close to your eye. Summary  A dental abscess is an area of pus in or around a tooth. It is caused by an infection.  Treatment will help with symptoms and prevent the infection from spreading.  Take over-the-counter and prescription medicines only as told by your dentist.  To prevent an abscess, take good care of your teeth. Brush your teeth every morning and night. Use floss every day.  Get dental cleanings as often as told by your dentist. This information is not intended to replace advice given to you by your health care provider. Make sure you discuss any questions you have with your health care provider. Document Revised: 08/13/2018 Document Reviewed: 12/24/2016 Elsevier Patient Education  2020 [employer]  

## 2019-11-06 NOTE — Progress Notes (Signed)
Molly Newman is a 69 y.o. female with the following history as recorded in [employer]:  Patient Active Problem List   Diagnosis Date Noted  . Bilateral carotid bruits 04/15/2019  . Rash 06/27/2017  . Midsternal chest pain 01/02/2016  . Well adult exam 11/16/2011  . Dysphagia 11/16/2011  . Goiter 11/16/2011  . Cough 09/08/2010  . Upper respiratory infection 09/03/2010  . WEIGHT GAIN, ABNORMAL 06/07/2010  . LARYNGITIS, ACUTE 05/11/2009  . ABNORMAL GLUCOSE NEC 03/24/2008  . Essential hypertension 03/27/2007  . ASTHMATIC BRONCHITIS, ACUTE 03/27/2007  . Allergic rhinitis 03/27/2007    Current Outpatient Medications  Medication Sig Dispense Refill  . amLODipine (NORVASC) 10 MG tablet TAKE 1 TABLET(10 MG) BY MOUTH DAILY 30 tablet 11  . carvedilol (COREG) 25 MG tablet Take 1 tablet (25 mg total) by mouth 2 (two) times daily with a meal. 180 tablet 3  . Cholecalciferol (VITAMIN D3) 1000 UNITS tablet Take 1,000 Units by mouth daily.      . fexofenadine (ALLEGRA) 180 MG tablet Take 180 mg by mouth daily as needed for allergies or rhinitis.    . furosemide (LASIX) 20 MG tablet TAKE 1 TABLET(20 MG) BY MOUTH DAILY AS NEEDED FOR SWELLING 90 tablet 0  . omeprazole (PRILOSEC) 40 MG capsule TAKE 1 CAPSULE(40 MG) BY MOUTH DAILY 90 capsule 3  . tolterodine (DETROL LA) 4 MG 24 hr capsule Take 4 mg by mouth as needed.    Marland Kitchen amoxicillin-clavulanate (AUGMENTIN) 875-125 MG tablet Take 1 tablet by mouth 2 (two) times daily for 10 days. 20 tablet 0  . HYDROcodone-acetaminophen (NORCO) 5-325 MG tablet Take 1 tablet by mouth every 6 (six) hours as needed for moderate pain. 15 tablet 0   No current facility-administered medications for this visit.    Allergies: Ace inhibitors and Diltiazem hcl  Past Medical History:  Diagnosis Date  . Allergy   . Cancer ([place])    30 years ago  . GERD (gastroesophageal reflux disease)   . Heart murmur    as a child  . Hypertension     History reviewed. No pertinent  surgical history.  Family History  Problem Relation Age of Onset  . Hypertension Other   . Colon polyps Brother   . Stroke Sister   . Colon cancer Neg Hx   . Esophageal cancer Neg Hx   . Rectal cancer Neg Hx   . Stomach cancer Neg Hx     Social History   Tobacco Use  . Smoking status: Never Smoker  . Smokeless tobacco: Never Used  Substance Use Topics  . Alcohol use: Yes    Comment: occasionally    Subjective:  Presents with suspected dental infection; right side of face swollen x 3-4 days; scheduled to see her dentist next week; notes that area is quite painful and not responding to Ibuprofen or Tylenol;  Objective:  Vitals:   11/06/19 1026  BP: 128/70  Pulse: 74  Temp: 98.3 F (36.8 C)  TempSrc: Oral  SpO2: 98%  Weight: 207 lb 4 oz (94 kg)  Height: 5\' 8"  (1.727 m)    General: Well developed, well nourished, in no acute distress  Skin : Warm and dry.  Head: Normocephalic and atraumatic; marked swelling noted at lower right cheek/ dental caries noted  Eyes: Sclera and conjunctiva clear; pupils round and reactive to light; extraocular movements intact  Ears: External normal; canals clear; tympanic membranes normal  Oropharynx: Pink, supple. No suspicious lesions  Neck: Supple without thyromegaly, adenopathy  Lungs: Respirations unlabored; clear to auscultation bilaterally without wheeze, rales, rhonchi  Neurologic: Alert and oriented; speech intact; face symmetrical; moves all extremities well; CNII-XII intact without focal deficit  Assessment:  1. Dental infection     Plan:  Keep planned follow-up with dentist; Rx for Augmentin 875 mg bid x 10 days, Norco 5/325 1 po q 4-6 hours prn pain;  This visit occurred during the SARS-CoV-2 public health emergency.  Safety protocols were in place, including screening questions prior to the visit, additional usage of staff PPE, and extensive cleaning of exam room while observing appropriate contact time as indicated for  disinfecting solutions.     No follow-ups on file.  No orders of the defined types were placed in this encounter.   Requested Prescriptions   Signed Prescriptions Disp Refills  . amoxicillin-clavulanate (AUGMENTIN) 875-125 MG tablet 20 tablet 0    Sig: Take 1 tablet by mouth 2 (two) times daily for 10 days.  Marland Kitchen HYDROcodone-acetaminophen (NORCO) 5-325 MG tablet 15 tablet 0    Sig: Take 1 tablet by mouth every 6 (six) hours as needed for moderate pain.

## 2019-11-07 LAB — URINE CULTURE

## 2019-11-10 NOTE — Progress Notes (Signed)
Patient notified; she is on Augmentin for dental infection and doing well; she will plan to have her urine re-checked at her upcoming appointment with her PCP in 2 weeks.

## 2019-11-18 DIAGNOSIS — K006 Disturbances in tooth eruption: Secondary | ICD-10-CM | POA: Diagnosis not present

## 2019-11-23 ENCOUNTER — Ambulatory Visit: Payer: Medicare HMO | Admitting: Internal Medicine

## 2019-12-01 ENCOUNTER — Ambulatory Visit: Payer: Medicare HMO | Admitting: Internal Medicine

## 2019-12-15 ENCOUNTER — Other Ambulatory Visit: Payer: Self-pay

## 2019-12-15 ENCOUNTER — Encounter: Payer: Self-pay | Admitting: Internal Medicine

## 2019-12-15 ENCOUNTER — Ambulatory Visit (INDEPENDENT_AMBULATORY_CARE_PROVIDER_SITE_OTHER): Payer: Medicare HMO | Admitting: Internal Medicine

## 2019-12-15 DIAGNOSIS — M25572 Pain in left ankle and joints of left foot: Secondary | ICD-10-CM | POA: Insufficient documentation

## 2019-12-15 DIAGNOSIS — R609 Edema, unspecified: Secondary | ICD-10-CM | POA: Diagnosis not present

## 2019-12-15 DIAGNOSIS — G8929 Other chronic pain: Secondary | ICD-10-CM | POA: Diagnosis not present

## 2019-12-15 MED ORDER — DICLOFENAC SODIUM 1 % EX GEL: 1.0000 "application " | Freq: Four times a day (QID) | CUTANEOUS | 3 refills | Status: DC

## 2019-12-15 NOTE — Progress Notes (Signed)
Subjective:  Patient ID: Molly Newman, female    DOB: May 11, 1950  Age: 69 y.o. MRN: [id number]  CC: Follow-up (Pt states she was having some swelling in her ankles when she saw Mickel Baas on 7/2. Was told to f/u, but the swelling is gone)   HPI   [name] presents for L ankle swelling and pain at times Edema is better   Outpatient Medications Prior to Visit  Medication Sig Dispense Refill   amLODipine (NORVASC) 10 MG tablet TAKE 1 TABLET(10 MG) BY MOUTH DAILY 30 tablet 11   carvedilol (COREG) 25 MG tablet Take 1 tablet (25 mg total) by mouth 2 (two) times daily with a meal. 180 tablet 3   Cholecalciferol (VITAMIN D3) 1000 UNITS tablet Take 1,000 Units by mouth daily.       fexofenadine (ALLEGRA) 180 MG tablet Take 180 mg by mouth daily as needed for allergies or rhinitis.     furosemide (LASIX) 20 MG tablet TAKE 1 TABLET(20 MG) BY MOUTH DAILY AS NEEDED FOR SWELLING 90 tablet 0   HYDROcodone-acetaminophen (NORCO) 5-325 MG tablet Take 1 tablet by mouth every 6 (six) hours as needed for moderate pain. 15 tablet 0   omeprazole (PRILOSEC) 40 MG capsule TAKE 1 CAPSULE(40 MG) BY MOUTH DAILY 90 capsule 3   tolterodine (DETROL LA) 4 MG 24 hr capsule Take 4 mg by mouth as needed.     No facility-administered medications prior to visit.    ROS: Review of Systems  Constitutional: Negative for fatigue and fever.  Cardiovascular: Negative for leg swelling.  Musculoskeletal: Positive for arthralgias.  Psychiatric/Behavioral: The patient is not nervous/anxious.     Objective:  BP 122/76 (BP Location: Left Arm)    Pulse (!) 55    Temp 97.8 F (36.6 C) (Oral)    Wt 205 lb (93 kg)    SpO2 97%    BMI 31.17 kg/m   BP Readings from Last 3 Encounters:  12/15/19 122/76  11/06/19 128/70  11/02/19 118/70    Wt Readings from Last 3 Encounters:  12/15/19 205 lb (93 kg)  11/06/19 207 lb 4 oz (94 kg)  11/02/19 202 lb (91.6 kg)    Physical Exam Constitutional:       General: She is not in acute distress.    Appearance: Normal appearance.  Musculoskeletal:        General: Swelling present. No tenderness, deformity or signs of injury.     Right lower leg: No edema.     Left lower leg: No edema.  Neurological:     Mental Status: She is oriented to person, place, and time.     Sensory: No sensory deficit.     Coordination: Coordination normal.     Gait: Gait normal.  Psychiatric:        Mood and Affect: Mood normal.   L dorsal ankle aspect is "fuller" and sensitive to palpation  Lab Results  Component Value Date   WBC 7.6 11/02/2019   HGB 11.2 (L) 11/02/2019   HCT 33.9 (L) 11/02/2019   PLT 258.0 11/02/2019   GLUCOSE 94 11/02/2019   CHOL 85 04/15/2019   TRIG 81.0 04/15/2019   HDL 48.20 04/15/2019   LDLCALC 21 04/15/2019   ALT 12 11/02/2019   AST 14 11/02/2019   NA 136 11/02/2019   K 3.8 11/02/2019   CL 103 11/02/2019   CREATININE 1.24 (H) 11/02/2019   BUN 12 11/02/2019   CO2 25 11/02/2019   TSH 2.86 04/15/2019  HGBA1C 6.1 04/15/2019    No results found.  Assessment & Plan:    Walker Kehr, MD

## 2019-12-15 NOTE — Assessment & Plan Note (Signed)
B LEs - multifactorial Lasix prn

## 2019-12-15 NOTE — Assessment & Plan Note (Addendum)
MSK Voltaren gel

## 2020-03-09 ENCOUNTER — Other Ambulatory Visit: Payer: Self-pay | Admitting: *Deleted

## 2020-03-09 MED ORDER — CARVEDILOL 25 MG PO TABS: 25.0000 mg | ORAL_TABLET | Freq: Two times a day (BID) | ORAL | 0 refills | Status: DC

## 2020-03-09 MED ORDER — OMEPRAZOLE 40 MG PO CPDR: 40.0000 mg | DELAYED_RELEASE_CAPSULE | Freq: Every day | ORAL | 0 refills | Status: DC

## 2020-03-09 MED ORDER — AMLODIPINE BESYLATE 10 MG PO TABS
10.0000 mg | ORAL_TABLET | Freq: Every day | ORAL | 0 refills | Status: DC
Start: 2020-03-09 — End: 2020-04-04

## 2020-03-18 DIAGNOSIS — Z1231 Encounter for screening mammogram for malignant neoplasm of breast: Secondary | ICD-10-CM | POA: Diagnosis not present

## 2020-03-18 DIAGNOSIS — Z01419 Encounter for gynecological examination (general) (routine) without abnormal findings: Secondary | ICD-10-CM | POA: Diagnosis not present

## 2020-03-18 DIAGNOSIS — Z6831 Body mass index (BMI) 31.0-31.9, adult: Secondary | ICD-10-CM | POA: Diagnosis not present

## 2020-03-18 DIAGNOSIS — D259 Leiomyoma of uterus, unspecified: Secondary | ICD-10-CM | POA: Diagnosis not present

## 2020-04-02 ENCOUNTER — Other Ambulatory Visit: Payer: Self-pay | Admitting: Internal Medicine

## 2020-04-18 ENCOUNTER — Other Ambulatory Visit: Payer: Self-pay

## 2020-04-18 ENCOUNTER — Encounter: Payer: Self-pay | Admitting: Internal Medicine

## 2020-04-18 ENCOUNTER — Ambulatory Visit (INDEPENDENT_AMBULATORY_CARE_PROVIDER_SITE_OTHER): Payer: Medicare HMO | Admitting: Internal Medicine

## 2020-04-18 DIAGNOSIS — Z0000 Encounter for general adult medical examination without abnormal findings: Secondary | ICD-10-CM

## 2020-04-18 DIAGNOSIS — N390 Urinary tract infection, site not specified: Secondary | ICD-10-CM | POA: Diagnosis not present

## 2020-04-18 DIAGNOSIS — I10 Essential (primary) hypertension: Secondary | ICD-10-CM | POA: Diagnosis not present

## 2020-04-18 DIAGNOSIS — N289 Disorder of kidney and ureter, unspecified: Secondary | ICD-10-CM

## 2020-04-18 LAB — COMPREHENSIVE METABOLIC PANEL
ALT: 14 U/L (ref 0–35)
AST: 18 U/L (ref 0–37)
Albumin: 4.3 g/dL (ref 3.5–5.2)
Alkaline Phosphatase: 75 U/L (ref 39–117)
BUN: 17 mg/dL (ref 6–23)
CO2: 28 mEq/L (ref 19–32)
Calcium: 9.4 mg/dL (ref 8.4–10.5)
Chloride: 102 mEq/L (ref 96–112)
Creatinine, Ser: 1 mg/dL (ref 0.40–1.20)
GFR: 58.26 mL/min — ABNORMAL LOW (ref >60.00)
Glucose, Bld: 89 mg/dL (ref 70–99)
Potassium: 4.2 mEq/L (ref 3.5–5.1)
Sodium: 137 mEq/L (ref 135–145)
Total Bilirubin: 0.5 mg/dL (ref 0.2–1.2)
Total Protein: 8 g/dL (ref 6.0–8.3)

## 2020-04-18 LAB — URINALYSIS, ROUTINE W REFLEX MICROSCOPIC
Bilirubin Urine: NEGATIVE
Hgb urine dipstick: NEGATIVE
Ketones, ur: NEGATIVE
Nitrite: POSITIVE — AB
Specific Gravity, Urine: 1.015 (ref 1.000–1.030)
Total Protein, Urine: NEGATIVE
Urine Glucose: NEGATIVE
Urobilinogen, UA: 0.2 (ref 0.0–1.0)
pH: 7 (ref 5.0–8.0)

## 2020-04-18 LAB — LIPID PANEL
Cholesterol: 92 mg/dL (ref 0–200)
HDL: 48.5 mg/dL (ref >39.00)
LDL Cholesterol: 29 mg/dL (ref 0–99)
NonHDL: 43.12
Total CHOL/HDL Ratio: 2
Triglycerides: 71 mg/dL (ref 0.0–149.0)
VLDL: 14.2 mg/dL (ref 0.0–40.0)

## 2020-04-18 LAB — TSH: TSH: 2.74 u[IU]/mL (ref 0.35–4.50)

## 2020-04-18 MED ORDER — CARVEDILOL 25 MG PO TABS
25.0000 mg | ORAL_TABLET | Freq: Two times a day (BID) | ORAL | 3 refills | Status: DC
Start: 2020-04-18 — End: 2020-04-18

## 2020-04-18 MED ORDER — CARVEDILOL 25 MG PO TABS
25.0000 mg | ORAL_TABLET | Freq: Two times a day (BID) | ORAL | 3 refills | Status: DC
Start: 2020-04-18 — End: 2021-03-21

## 2020-04-18 MED ORDER — AMLODIPINE BESYLATE 10 MG PO TABS
ORAL_TABLET | ORAL | 3 refills | Status: DC
Start: 2020-04-18 — End: 2021-03-21

## 2020-04-18 NOTE — Assessment & Plan Note (Signed)
EKG

## 2020-04-18 NOTE — Patient Instructions (Signed)

## 2020-04-18 NOTE — Assessment & Plan Note (Signed)
We discussed age appropriate health related issues, including available/recomended screening tests and vaccinations. We discussed a need for adhering to healthy diet and exercise. Labs ordered. All questions were answered. Caring for a 67 yo mother Molly Newman due 2022 - polyps (Dr Hilarie Fredrickson) Eye exam q 12 mo Mammo, PAP A  cardiac CT scan for calcium scoring  Offered 12/20, 12/21

## 2020-04-18 NOTE — Progress Notes (Signed)
Subjective:  Patient ID: Molly Newman, female    DOB: July 08, 1950  Age: 69 y.o. MRN: [id number]  CC: Annual Exam   HPI Molly Newman presents for HTN, edema Well exam  Outpatient Medications Prior to Visit  Medication Sig Dispense Refill  . amLODipine (NORVASC) 10 MG tablet TAKE 1 TABLET(10 MG) BY MOUTH DAILY 90 tablet 3  . Cholecalciferol (VITAMIN D3) 1000 UNITS tablet Take 1,000 Units by mouth daily.    . fexofenadine (ALLEGRA) 180 MG tablet Take 180 mg by mouth daily as needed for allergies or rhinitis.    Marland Kitchen omeprazole (PRILOSEC) 40 MG capsule TAKE 1 CAPSULE(40 MG) BY MOUTH DAILY 90 capsule 3  . tolterodine (DETROL LA) 4 MG 24 hr capsule Take 4 mg by mouth as needed.    . carvedilol (COREG) 25 MG tablet Take 1 tablet (25 mg total) by mouth 2 (two) times daily with a meal. Annual appt due in Dec w/labs must see provider for future refills 180 tablet 0  . FLUZONE HIGH-DOSE QUADRIVALENT 0.7 ML SUSY     . diclofenac Sodium (VOLTAREN) 1 % GEL Apply 1 application topically 4 (four) times daily. (Patient not taking: Reported on 04/18/2020) 100 g 3  . furosemide (LASIX) 20 MG tablet TAKE 1 TABLET(20 MG) BY MOUTH DAILY AS NEEDED FOR SWELLING (Patient not taking: Reported on 04/18/2020) 90 tablet 0  . HYDROcodone-acetaminophen (NORCO) 5-325 MG tablet Take 1 tablet by mouth every 6 (six) hours as needed for moderate pain. (Patient not taking: Reported on 04/18/2020) 15 tablet 0   No facility-administered medications prior to visit.    ROS: Review of Systems  Constitutional: Negative for activity change, appetite change, chills, fatigue and unexpected weight change.  HENT: Negative for congestion, mouth sores and sinus pressure.   Eyes: Negative for visual disturbance.  Respiratory: Negative for cough and chest tightness.   Gastrointestinal: Negative for abdominal pain and nausea.  Genitourinary: Negative for difficulty urinating, frequency and vaginal pain.  Musculoskeletal:  Negative for back pain and gait problem.  Skin: Negative for pallor and rash.  Neurological: Negative for dizziness, tremors, weakness, numbness and headaches.  Psychiatric/Behavioral: Negative for confusion, sleep disturbance and suicidal ideas.    Objective:  BP 120/82 (BP Location: Left Arm)   Pulse (!) 55   Temp 98 F (36.7 C) (Oral)   Ht 5\' 8"  (1.727 m)   Wt 203 lb 6.4 oz (92.3 kg)   SpO2 98%   BMI 30.93 kg/m   BP Readings from Last 3 Encounters:  04/18/20 120/82  12/15/19 122/76  11/06/19 128/70    Wt Readings from Last 3 Encounters:  04/18/20 203 lb 6.4 oz (92.3 kg)  12/15/19 205 lb (93 kg)  11/06/19 207 lb 4 oz (94 kg)    Physical Exam Constitutional:      General: She is not in acute distress.    Appearance: She is well-developed.  HENT:     Head: Normocephalic.     Right Ear: External ear normal.     Left Ear: External ear normal.     Nose: Nose normal.     Mouth/Throat:     Mouth: Oropharynx is clear and moist.  Eyes:     General:        Right eye: No discharge.        Left eye: No discharge.     Conjunctiva/sclera: Conjunctivae normal.     Pupils: Pupils are equal, round, and reactive to light.  Neck:  Thyroid: No thyromegaly.     Vascular: No JVD.     Trachea: No tracheal deviation.  Cardiovascular:     Rate and Rhythm: Normal rate and regular rhythm.     Heart sounds: Normal heart sounds.  Pulmonary:     Effort: No respiratory distress.     Breath sounds: No stridor. No wheezing.  Abdominal:     General: Bowel sounds are normal. There is no distension.     Palpations: Abdomen is soft. There is no mass.     Tenderness: There is no abdominal tenderness. There is no guarding or rebound.  Musculoskeletal:        General: No tenderness or edema.     Cervical back: Normal range of motion and neck supple.  Lymphadenopathy:     Cervical: No cervical adenopathy.  Skin:    Findings: No erythema or rash.  Neurological:     Mental Status: She  is oriented to person, place, and time.     Cranial Nerves: No cranial nerve deficit.     Motor: No abnormal muscle tone.     Coordination: Coordination normal.     Deep Tendon Reflexes: Reflexes normal.  Psychiatric:        Mood and Affect: Mood and affect normal.        Behavior: Behavior normal.        Thought Content: Thought content normal.        Judgment: Judgment normal.    Procedure: EKG Indication: HTN Impression: S brady. No acute changes.  Lab Results  Component Value Date   WBC 7.6 11/02/2019   HGB 11.2 (L) 11/02/2019   HCT 33.9 (L) 11/02/2019   PLT 258.0 11/02/2019   GLUCOSE 94 11/02/2019   CHOL 85 04/15/2019   TRIG 81.0 04/15/2019   HDL 48.20 04/15/2019   LDLCALC 21 04/15/2019   ALT 12 11/02/2019   AST 14 11/02/2019   NA 136 11/02/2019   K 3.8 11/02/2019   CL 103 11/02/2019   CREATININE 1.24 (H) 11/02/2019   BUN 12 11/02/2019   CO2 25 11/02/2019   TSH 2.86 04/15/2019   HGBA1C 6.1 04/15/2019    No results found.  Assessment & Plan:    Walker Kehr, MD

## 2020-04-19 DIAGNOSIS — N390 Urinary tract infection, site not specified: Secondary | ICD-10-CM | POA: Insufficient documentation

## 2020-04-19 DIAGNOSIS — N289 Disorder of kidney and ureter, unspecified: Secondary | ICD-10-CM | POA: Insufficient documentation

## 2020-04-19 MED ORDER — CEPHALEXIN 500 MG PO CAPS: 500.0000 mg | ORAL_CAPSULE | Freq: Four times a day (QID) | ORAL | 0 refills | Status: DC

## 2020-04-19 NOTE — Assessment & Plan Note (Signed)
Hydrate well.  Obtain renal ultrasound 

## 2020-04-19 NOTE — Assessment & Plan Note (Signed)
Likely asymptomatic recurrent cystitis.  Will treat with an antibiotic.  Obtain renal ultrasound

## 2020-04-20 ENCOUNTER — Telehealth: Payer: Self-pay | Admitting: Internal Medicine

## 2020-04-20 NOTE — Telephone Encounter (Signed)
Called pt there ws no answer LMOM RTC.Marland KitchenJohny Newman

## 2020-04-20 NOTE — Telephone Encounter (Signed)
Patient is requesting a call back in regards to her recent lab results. [phone].

## 2020-04-20 NOTE — Telephone Encounter (Signed)
MD release results to mychart...  Dear Molly Newman, Your labs/tests are good, except for a slightly decreased kidney function test called GFR (as it is better than last time). You need to hydrate yourself well. You also have a urinary tract infection like you had last year. I will send an antibiotic prescription for you to take. I would like to obtain a renal ultrasound to make sure they are okay. Have a Merry Christmas! Sincerely, AP

## 2020-04-21 NOTE — Telephone Encounter (Signed)
   Please return call to patient to discuss results

## 2020-04-21 NOTE — Telephone Encounter (Signed)
Called pt gave MD response on labs.Molly KitchenJohny Chess

## 2020-05-05 ENCOUNTER — Other Ambulatory Visit: Payer: Self-pay | Admitting: Internal Medicine

## 2020-05-11 ENCOUNTER — Ambulatory Visit
Admission: RE | Admit: 2020-05-11 | Discharge: 2020-05-11 | Disposition: A | Discharge status: Home / Self Care | Payer: Medicare HMO | Source: Ambulatory Visit | Attending: Internal Medicine | Admitting: Internal Medicine

## 2020-05-11 DIAGNOSIS — N390 Urinary tract infection, site not specified: Secondary | ICD-10-CM | POA: Diagnosis not present

## 2020-05-11 DIAGNOSIS — N289 Disorder of kidney and ureter, unspecified: Secondary | ICD-10-CM

## 2020-05-11 DIAGNOSIS — N3289 Other specified disorders of bladder: Secondary | ICD-10-CM | POA: Diagnosis not present

## 2020-11-03 DIAGNOSIS — H2513 Age-related nuclear cataract, bilateral: Secondary | ICD-10-CM | POA: Diagnosis not present

## 2020-11-03 DIAGNOSIS — H43811 Vitreous degeneration, right eye: Secondary | ICD-10-CM | POA: Diagnosis not present

## 2020-11-03 DIAGNOSIS — H53002 Unspecified amblyopia, left eye: Secondary | ICD-10-CM | POA: Diagnosis not present

## 2020-11-30 DIAGNOSIS — H5213 Myopia, bilateral: Secondary | ICD-10-CM | POA: Diagnosis not present

## 2020-11-30 DIAGNOSIS — H524 Presbyopia: Secondary | ICD-10-CM | POA: Diagnosis not present

## 2020-11-30 DIAGNOSIS — H5203 Hypermetropia, bilateral: Secondary | ICD-10-CM | POA: Diagnosis not present

## 2020-11-30 DIAGNOSIS — H52209 Unspecified astigmatism, unspecified eye: Secondary | ICD-10-CM | POA: Diagnosis not present

## 2020-12-28 ENCOUNTER — Encounter: Payer: Self-pay | Admitting: Internal Medicine

## 2021-03-21 ENCOUNTER — Other Ambulatory Visit: Payer: Self-pay | Admitting: Internal Medicine

## 2021-04-21 ENCOUNTER — Telehealth: Payer: Self-pay | Admitting: Internal Medicine

## 2021-04-21 NOTE — Telephone Encounter (Signed)
Patient states per her insurance she has to have her cpe done by 05-06-2021  Patient scheduled her cpe on 04-12-21, cpe is scheduled for 05-09-2021  Informed patient provider does not have any openings for a cpe prior to 05-06-2021  Patient requesting a call back

## 2021-04-21 NOTE — Telephone Encounter (Signed)
No appt available Pls advise if you want to add-on before 05/06/21 for CPX.Marland KitchenJohny Newman

## 2021-04-23 NOTE — Telephone Encounter (Signed)
If there is a compelling reason for Molly Newman to have wellness before the end of the year we can put her on a cancellation list.  Thanks

## 2021-05-02 DIAGNOSIS — Z01411 Encounter for gynecological examination (general) (routine) with abnormal findings: Secondary | ICD-10-CM | POA: Diagnosis not present

## 2021-05-02 DIAGNOSIS — Z1231 Encounter for screening mammogram for malignant neoplasm of breast: Secondary | ICD-10-CM | POA: Diagnosis not present

## 2021-05-02 DIAGNOSIS — Z130 Encounter for screening for diseases of the blood and blood-forming organs and certain disorders involving the immune mechanism: Secondary | ICD-10-CM | POA: Diagnosis not present

## 2021-05-02 DIAGNOSIS — Z1329 Encounter for screening for other suspected endocrine disorder: Secondary | ICD-10-CM | POA: Diagnosis not present

## 2021-05-02 DIAGNOSIS — Z13220 Encounter for screening for lipoid disorders: Secondary | ICD-10-CM | POA: Diagnosis not present

## 2021-05-02 DIAGNOSIS — D259 Leiomyoma of uterus, unspecified: Secondary | ICD-10-CM | POA: Diagnosis not present

## 2021-05-02 DIAGNOSIS — Z1239 Encounter for other screening for malignant neoplasm of breast: Secondary | ICD-10-CM | POA: Diagnosis not present

## 2021-05-02 DIAGNOSIS — Z6830 Body mass index (BMI) 30.0-30.9, adult: Secondary | ICD-10-CM | POA: Diagnosis not present

## 2021-05-09 ENCOUNTER — Other Ambulatory Visit: Payer: Self-pay

## 2021-05-09 ENCOUNTER — Ambulatory Visit (INDEPENDENT_AMBULATORY_CARE_PROVIDER_SITE_OTHER): Payer: Medicare HMO | Admitting: Internal Medicine

## 2021-05-09 ENCOUNTER — Encounter: Payer: Self-pay | Admitting: Internal Medicine

## 2021-05-09 VITALS — BP 122/76 | HR 59 | Temp 98.0°F | Ht 68.0 in | Wt 198.2 lb

## 2021-05-09 DIAGNOSIS — I10 Essential (primary) hypertension: Secondary | ICD-10-CM

## 2021-05-09 DIAGNOSIS — Z0000 Encounter for general adult medical examination without abnormal findings: Secondary | ICD-10-CM | POA: Diagnosis not present

## 2021-05-09 DIAGNOSIS — F0390 Unspecified dementia without behavioral disturbance: Secondary | ICD-10-CM | POA: Diagnosis not present

## 2021-05-09 LAB — URINALYSIS, ROUTINE W REFLEX MICROSCOPIC
Bilirubin Urine: NEGATIVE
Hgb urine dipstick: NEGATIVE
Ketones, ur: NEGATIVE
Nitrite: POSITIVE — AB
Specific Gravity, Urine: 1.01 (ref 1.000–1.030)
Total Protein, Urine: NEGATIVE
Urine Glucose: NEGATIVE
Urobilinogen, UA: 1 (ref 0.0–1.0)
pH: 6 (ref 5.0–8.0)

## 2021-05-09 MED ORDER — OMEPRAZOLE 40 MG PO CPDR: DELAYED_RELEASE_CAPSULE | ORAL | 3 refills | Status: DC

## 2021-05-09 MED ORDER — CARVEDILOL 25 MG PO TABS: 25.0000 mg | ORAL_TABLET | Freq: Two times a day (BID) | ORAL | 3 refills | Status: DC

## 2021-05-09 MED ORDER — TOLTERODINE TARTRATE ER 4 MG PO CP24: 4.0000 mg | ORAL_CAPSULE | ORAL | 3 refills | Status: DC | PRN

## 2021-05-09 MED ORDER — AMLODIPINE BESYLATE 10 MG PO TABS: ORAL_TABLET | ORAL | 3 refills | Status: DC

## 2021-05-09 NOTE — Assessment & Plan Note (Addendum)
°  We discussed age appropriate health related issues, including available/recomended screening tests and vaccinations. Labs were ordered to be later reviewed . All questions were answered. We discussed one or more of the following - seat belt use, use of sunscreen/sun exposure exercise, fall risk reduction, second hand smoke exposure, firearm use and storage, seat belt use, a need for adhering to healthy diet and exercise. Labs were ordered.  All questions were answered.    lives w/mom - 47 yo w/dementia; retired

## 2021-05-09 NOTE — Addendum Note (Signed)
Addended by: Cassandria Anger on: 05/09/2021 02:55 PM   Modules accepted: Orders

## 2021-05-09 NOTE — Progress Notes (Signed)
A well exam  Subjective:  Patient ID: Molly Newman, female    DOB: [birth year]  Age: 71 y.o. MRN: [id number]  CC: Annual Exam   HPI [name] presents for a well exam Pt had labs w/her GYN recently Now lives w/mom - 77 yo w/dementia; retired  Outpatient Medications Prior to Visit  Medication Sig Dispense Refill   amLODipine (NORVASC) 10 MG tablet TAKE 1 TABLET EVERY DAY 90 tablet 0   carvedilol (COREG) 25 MG tablet TAKE 1 TABLET TWICE DAILY WITH MEALS 180 tablet 0   Cholecalciferol (VITAMIN D3) 1000 UNITS tablet Take 1,000 Units by mouth daily.     fexofenadine (ALLEGRA) 180 MG tablet Take 180 mg by mouth daily as needed for allergies or rhinitis.     omeprazole (PRILOSEC) 40 MG capsule TAKE 1 CAPSULE EVERY DAY (NEED MD APPOINTMENT) 90 capsule 0   tolterodine (DETROL LA) 4 MG 24 hr capsule Take 4 mg by mouth as needed.     cephALEXin (KEFLEX) 500 MG capsule Take 1 capsule (500 mg total) by mouth 4 (four) times daily. (Patient not taking: Reported on 05/09/2021) 16 capsule 0   No facility-administered medications prior to visit.    ROS: Review of Systems  Constitutional:  Negative for activity change, appetite change, chills, fatigue and unexpected weight change.  HENT:  Negative for congestion, mouth sores and sinus pressure.   Eyes:  Negative for visual disturbance.  Respiratory:  Negative for cough and chest tightness.   Gastrointestinal:  Negative for abdominal pain and nausea.  Genitourinary:  Negative for difficulty urinating, frequency and vaginal pain.  Musculoskeletal:  Negative for back pain and gait problem.  Skin:  Negative for pallor and rash.  Neurological:  Negative for dizziness, tremors, weakness, numbness and headaches.  Psychiatric/Behavioral:  Negative for confusion and sleep disturbance.    Objective:  BP 122/76 (BP Location: Left Arm)    Pulse (!) 59    Temp 98 F (36.7 C) (Oral)    Ht 5\' 8"  (1.727 m)    Wt 198 lb 3.2 oz (89.9 kg)    SpO2 97%     BMI 30.14 kg/m   BP Readings from Last 3 Encounters:  05/09/21 122/76  04/18/20 120/82  12/15/19 122/76    Wt Readings from Last 3 Encounters:  05/09/21 198 lb 3.2 oz (89.9 kg)  04/18/20 203 lb 6.4 oz (92.3 kg)  12/15/19 205 lb (93 kg)    Physical Exam Constitutional:      General: She is not in acute distress.    Appearance: Normal appearance. She is well-developed.  HENT:     Head: Normocephalic.     Right Ear: External ear normal.     Left Ear: External ear normal.     Nose: Nose normal.  Eyes:     General:        Right eye: No discharge.        Left eye: No discharge.     Conjunctiva/sclera: Conjunctivae normal.     Pupils: Pupils are equal, round, and reactive to light.  Neck:     Thyroid: No thyromegaly.     Vascular: No JVD.     Trachea: No tracheal deviation.  Cardiovascular:     Rate and Rhythm: Normal rate and regular rhythm.     Heart sounds: Normal heart sounds.  Pulmonary:     Effort: No respiratory distress.     Breath sounds: No stridor. No wheezing.  Abdominal:  General: Bowel sounds are normal. There is no distension.     Palpations: Abdomen is soft. There is no mass.     Tenderness: There is no abdominal tenderness. There is no guarding or rebound.  Musculoskeletal:        General: No tenderness.     Cervical back: Normal range of motion and neck supple. No rigidity.  Lymphadenopathy:     Cervical: No cervical adenopathy.  Skin:    Findings: No erythema or rash.  Neurological:     Cranial Nerves: No cranial nerve deficit.     Motor: No abnormal muscle tone.     Coordination: Coordination normal.     Deep Tendon Reflexes: Reflexes normal.  Psychiatric:        Behavior: Behavior normal.        Thought Content: Thought content normal.        Judgment: Judgment normal.    Lab Results  Component Value Date   WBC 7.6 11/02/2019   HGB 11.2 (L) 11/02/2019   HCT 33.9 (L) 11/02/2019   PLT 258.0 11/02/2019   GLUCOSE 89 04/18/2020   CHOL  92 04/18/2020   TRIG 71.0 04/18/2020   HDL 48.50 04/18/2020   LDLCALC 29 04/18/2020   ALT 14 04/18/2020   AST 18 04/18/2020   NA 137 04/18/2020   K 4.2 04/18/2020   CL 102 04/18/2020   CREATININE 1.00 04/18/2020   BUN 17 04/18/2020   CO2 28 04/18/2020   TSH 2.74 04/18/2020   HGBA1C 6.1 04/15/2019    US RENAL  Result Date: 05/11/2020 CLINICAL DATA:  Decreased GFR Recurring UTIs EXAM: RENAL / URINARY TRACT ULTRASOUND COMPLETE COMPARISON:  None. FINDINGS: Right Kidney: Renal measurements: 9.5 x 3.4 x 5.4 = volume: 92 mL. Echogenicity within normal limits. No mass or hydronephrosis visualized. Left Kidney: Renal measurements: 9.3 x 4.6 x 5.5 = volume: 123 mL. Echogenicity within normal limits. No mass or hydronephrosis visualized. Bladder: Evaluation of the bladder is limited due to under distension. No significant abnormality identified. Bilateral ureteral jets are noted. Other: None. IMPRESSION: No significant abnormality of the kidneys or bladder. Electronically Signed   By: Miachel Roux M.D.   On: 05/11/2020 13:24    Assessment & Plan:   Problem List Items Addressed This Visit     Essential hypertension    Cont on Amlodipine, Coreg      Well adult exam - Primary     We discussed age appropriate health related issues, including available/recomended screening tests and vaccinations. Labs were ordered to be later reviewed . All questions were answered. We discussed one or more of the following - seat belt use, use of sunscreen/sun exposure exercise, fall risk reduction, second hand smoke exposure, firearm use and storage, seat belt use, a need for adhering to healthy diet and exercise. Labs were ordered.  All questions were answered.    lives w/mom - 64 yo w/dementia; retired      Relevant Orders   Urinalysis      No orders of the defined types were placed in this encounter.     Follow-up: Return in about 1 year (around 05/09/2022) for Wellness Exam.  Walker Kehr, MD

## 2021-05-09 NOTE — Assessment & Plan Note (Signed)
Cont on Amlodipine, Coreg

## 2021-05-18 ENCOUNTER — Telehealth: Payer: Self-pay | Admitting: Internal Medicine

## 2021-05-18 ENCOUNTER — Other Ambulatory Visit: Payer: Self-pay | Admitting: Internal Medicine

## 2021-05-18 MED ORDER — NITROFURANTOIN MONOHYD MACRO 100 MG PO CAPS: 100.0000 mg | ORAL_CAPSULE | Freq: Two times a day (BID) | ORAL | 0 refills | Status: AC

## 2021-05-18 NOTE — Telephone Encounter (Signed)
I emailed a prescription for antibiotic Macrobid to [facility].  Thank you

## 2021-05-18 NOTE — Telephone Encounter (Signed)
Patient calling in about urine results.. says she is not experiencing any urgency or frequency to urinate.  Would also like for provider to review outside lab results sent from OBGYN & give any recommendations   Please call [phone]

## 2021-05-19 NOTE — Telephone Encounter (Signed)
Notified pt w/MD response.../lmb 

## 2022-01-16 IMAGING — US US RENAL
1 series · 14 of 25 positions shown · non-contrast
Comparison: None.

CLINICAL DATA: Decreased GFR

Recurring UTIs
EXAM:
RENAL / URINARY TRACT ULTRASOUND COMPLETE

[Series 1: us renal · 0.23mm/px · 14 of 53 slices shown]
[im 1/53]
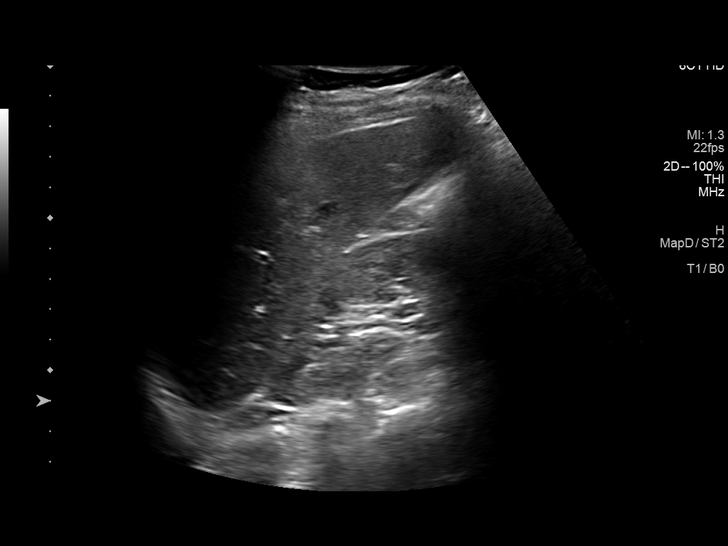
[im 5/53]
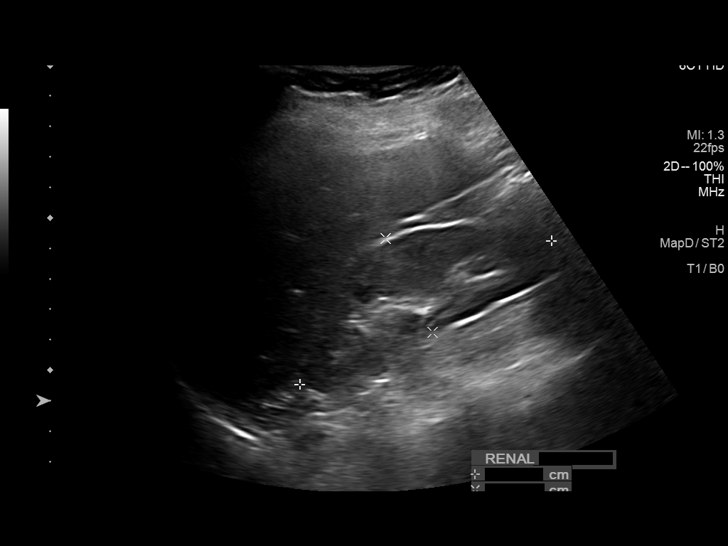
[im 9/53]
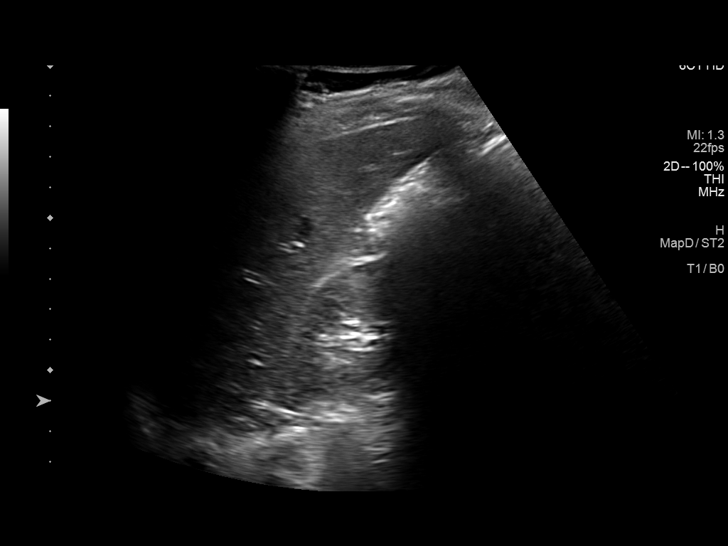
[im 14/53]
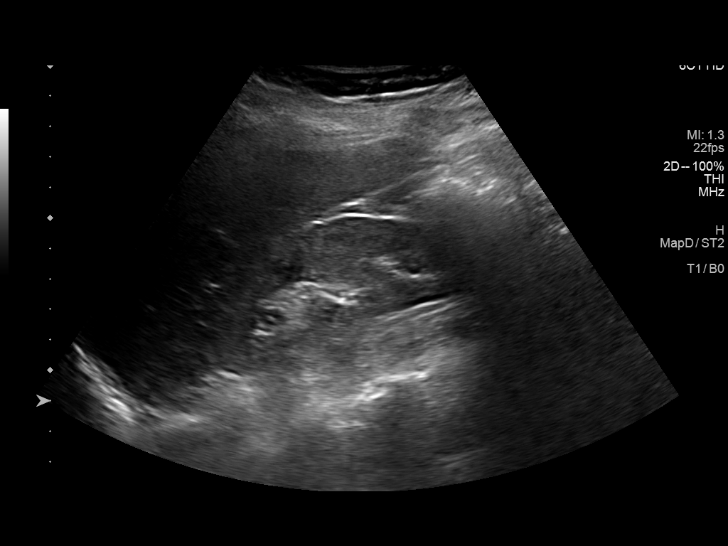
[im 18/53]
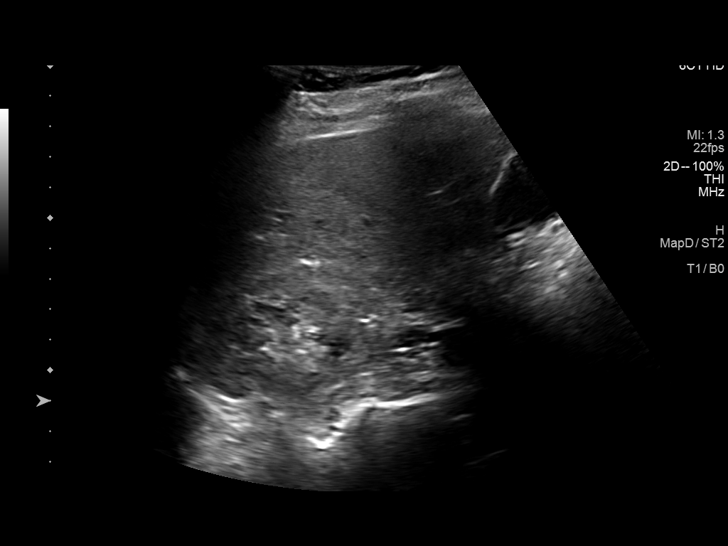
[im 20/53]
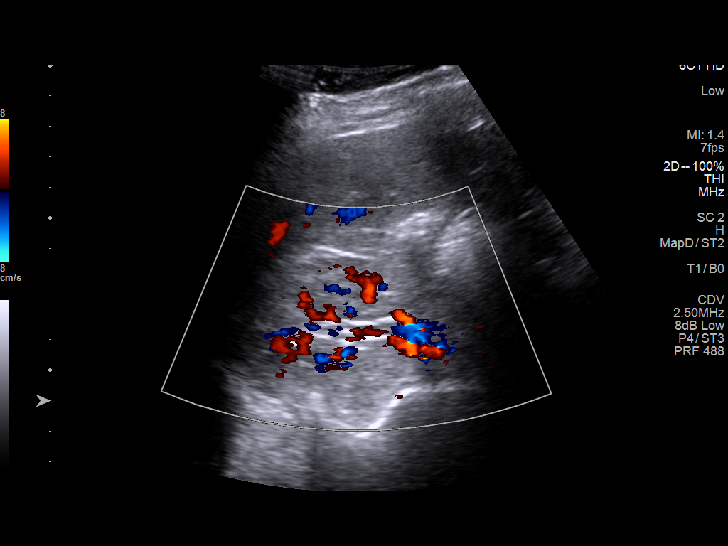
[im 24/53]
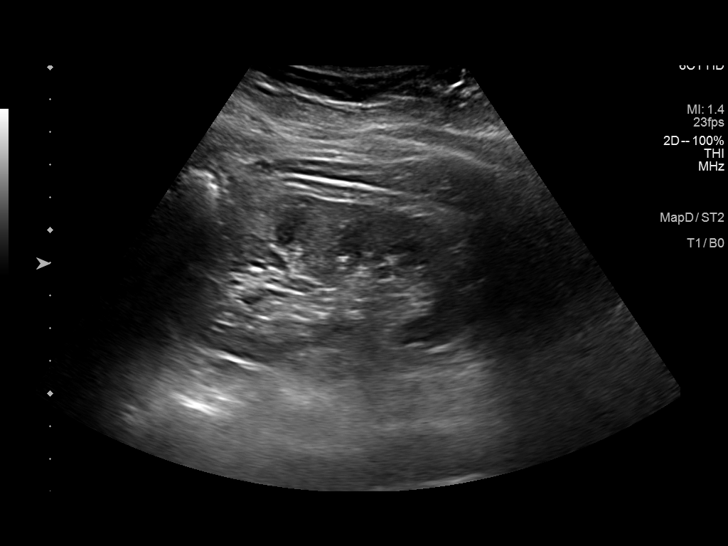
[im 29/53]
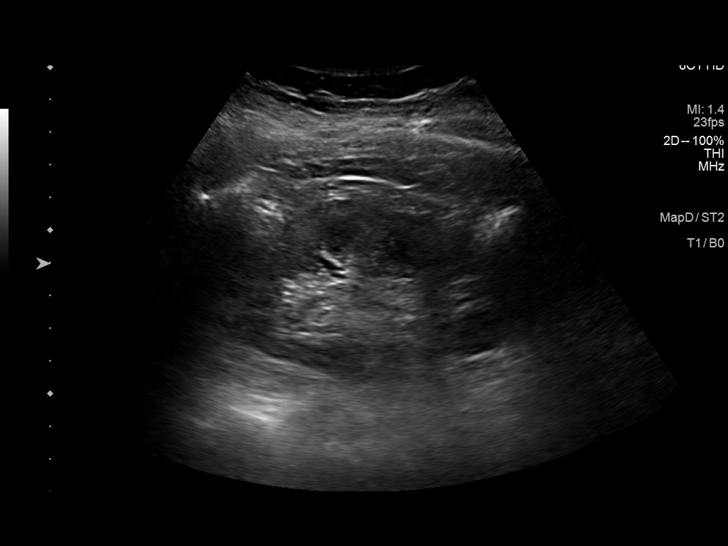
[im 33/53]
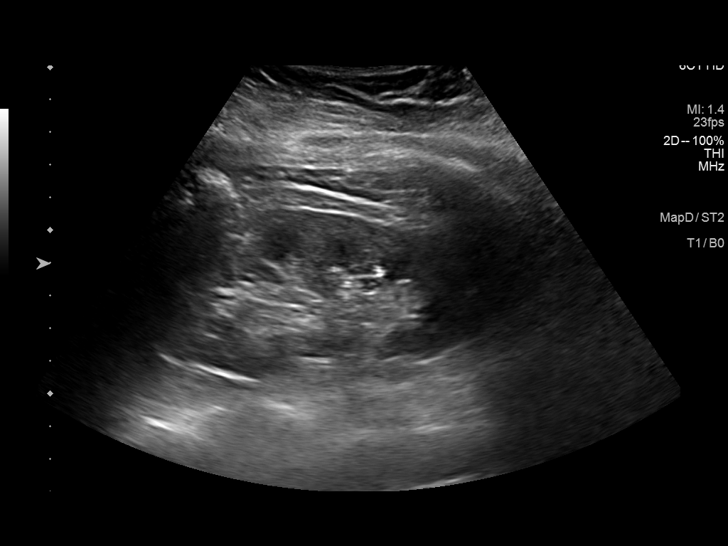
[im 35/53]
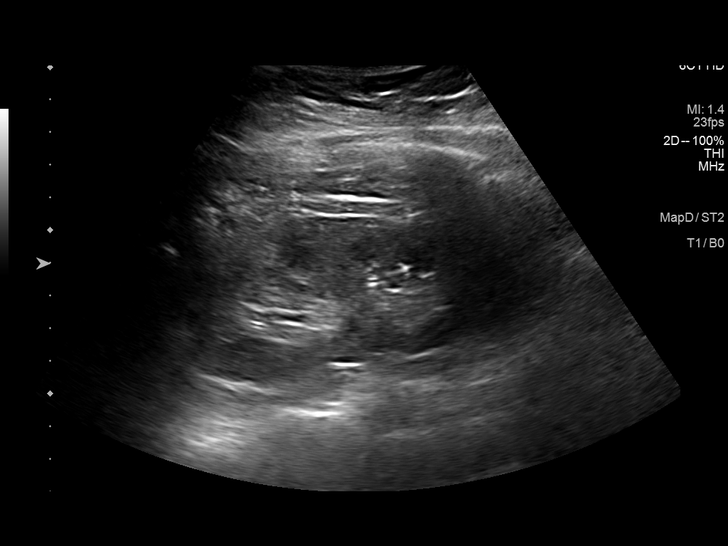
[im 40/53]
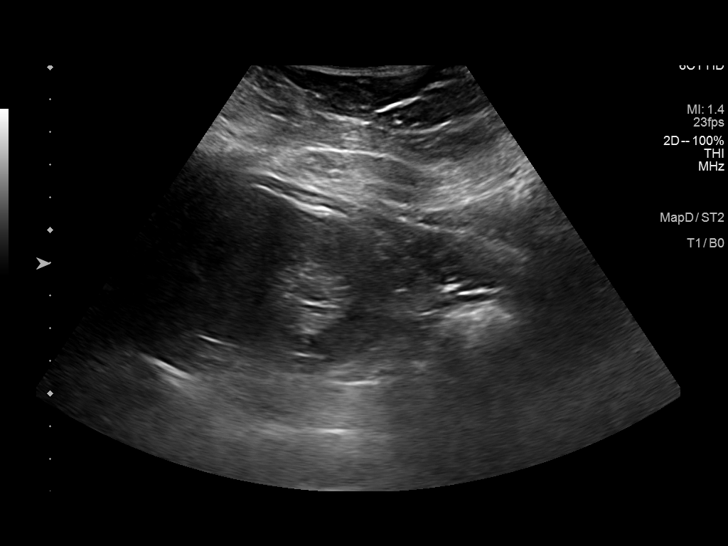
[im 44/53]
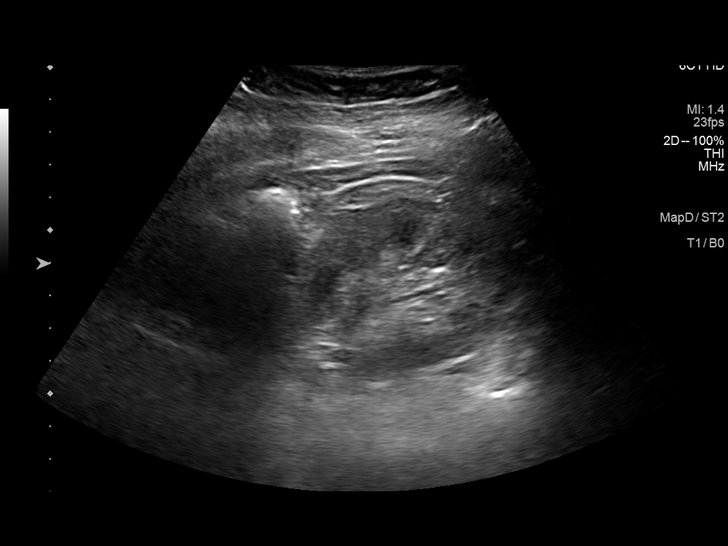
[im 48/53]
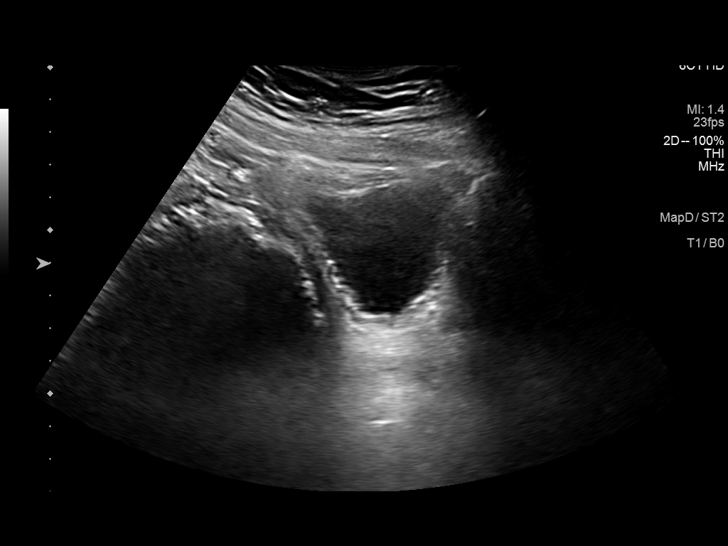
[im 53/53]
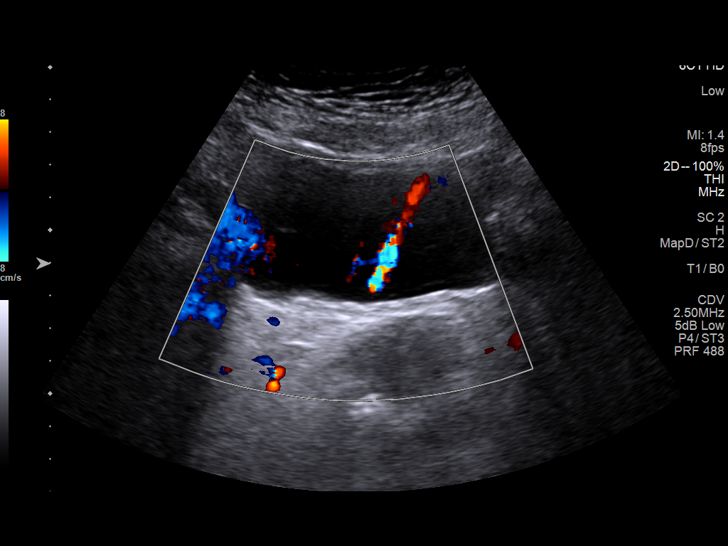

[14 of 25 positions shown; findings below may reference images not displayed]

FINDINGS: Right Kidney:

Renal measurements: 9.5 x 3.4 x 5.4 = volume: 92 mL. Echogenicity
within normal limits. No mass or hydronephrosis visualized.

Left Kidney:

Renal measurements: 9.3 x 4.6 x 5.5 = volume: 123 mL. Echogenicity
within normal limits. No mass or hydronephrosis visualized.

Bladder:

Evaluation of the bladder is limited due to under distension. No
significant abnormality identified. Bilateral ureteral jets are
noted.

Other:

None.
IMPRESSION: No significant abnormality of the kidneys or bladder.

## 2022-03-20 ENCOUNTER — Telehealth (INDEPENDENT_AMBULATORY_CARE_PROVIDER_SITE_OTHER): Payer: Medicare HMO | Admitting: Family Medicine

## 2022-03-20 DIAGNOSIS — U071 COVID-19: Secondary | ICD-10-CM

## 2022-03-20 MED ORDER — MOLNUPIRAVIR EUA 200MG CAPSULE: 4.0000 | ORAL_CAPSULE | Freq: Two times a day (BID) | ORAL | 0 refills | Status: AC

## 2022-03-20 MED ORDER — BENZONATATE 100 MG PO CAPS: 100.0000 mg | ORAL_CAPSULE | Freq: Three times a day (TID) | ORAL | 0 refills | Status: DC | PRN

## 2022-03-20 NOTE — Progress Notes (Signed)
Virtual Visit via Telephone Note  I connected with Molly Newman on 03/20/22 at  6:00 PM EST by telephone and verified that I am speaking with the correct person using two identifiers.   I discussed the limitations of performing an evaluation and management service by telephone and requested permission for a phone visit. The patient expressed understanding and agreed to proceed.  Location patient:  [place] Location provider: work or home office Participants present for the call: patient, provider Patient did not have a visit with me in the prior 7 days to address this/these issue(s).   History of Present Illness:  Acute telemedicine visit for Covid19: -Onset: 2 days ago -Symptoms include:  nasal congestion, cough mild -drinking fluids -Denies:fever, CP, SOB, NVD -Pertinent past medical history: see below, never had covid before -Pertinent medication allergies:  Allergies  Allergen Reactions   Ace Inhibitors    Diltiazem Hcl     REACTION: Hair Loss  -COVID-19 vaccine status: scheduled to have her booster - had all of the other doses Immunization History  Administered Date(s) Administered   Fluad Quad(high Dose 65+) 02/25/2020, 03/01/2021   Influenza, High Dose Seasonal PF 03/24/2019   Influenza,inj,Quad PF,6+ Mos 07/05/2015, 01/10/2017, 02/20/2018   Influenza-Unspecified 06/29/2016   PFIZER(Purple Top)SARS-COV-2 Vaccination 07/09/2019, 08/05/2019, 05/04/2020, 12/30/2020   Tdap 01/03/2015      Past Medical History:  Diagnosis Date   Allergy    Cancer ([place])    30 years ago   GERD (gastroesophageal reflux disease)    Heart murmur    as a child   Hypertension     Current Outpatient Medications on File Prior to Visit  Medication Sig Dispense Refill   amLODipine (NORVASC) 10 MG tablet TAKE 1 TABLET EVERY DAY 90 tablet 3   carvedilol (COREG) 25 MG tablet Take 1 tablet (25 mg total) by mouth 2 (two) times daily with a meal. 180 tablet 3   Cholecalciferol (VITAMIN D3) 1000  UNITS tablet Take 1,000 Units by mouth daily.     fexofenadine (ALLEGRA) 180 MG tablet Take 180 mg by mouth daily as needed for allergies or rhinitis.     omeprazole (PRILOSEC) 40 MG capsule TAKE 1 CAPSULE EVERY DAY (NEED MD APPOINTMENT) 90 capsule 3   tolterodine (DETROL LA) 4 MG 24 hr capsule Take 1 capsule (4 mg total) by mouth as needed. 90 capsule 3   No current facility-administered medications on file prior to visit.    Observations/Objective: Patient sounds cheerful and well on the phone. I do not appreciate any SOB. Speech and thought processing are grossly intact. Patient reported vitals:  Assessment and Plan:  COVID-19   Discussed treatment options, side effect and risk of drug interactions, ideal treatment window, potential complications, isolation and precautions for COVID-19.  Discussed possibility of rebound with or without antivirals. Checked for/reviewed last GFR - listed in HPI if available. After lengthy discussion, the patient opted for treatment with Legevrio due to being higher risk for complications of covid or severe disease and other factors. Discussed EUA status of this drug and the fact that there is preliminary limited knowledge of risks/interactions/side effects per EUA document vs possible benefits and precautions. This information was shared with patient during the visit and also was provided in patient instructions. Also, advised that patient discuss risks/interactions and use with pharmacist/treatment team as well. The patient did want a prescription for cough, Tessalon Rx sent.  Other symptomatic care measures summarized in patient instructions. Advised to seek prompt virtual visit or in  person care if worsening, new symptoms arise, or if is not improving with treatment as expected per our conversation of expected course. Discussed options for follow up care. Did let this patient know that I do telemedicine on Tuesdays and Thursdays for [facility] and those are the  days I am logged into the system. Advised to schedule follow up visit with PCP, [place] virtual visits or [facility] if any further questions or concerns to avoid delays in care.   I discussed the assessment and treatment plan with the patient. The patient was provided an opportunity to ask questions and all were answered. The patient agreed with the plan and demonstrated an understanding of the instructions.    Follow Up Instructions:  I did not refer this patient for an OV with me in the next 24 hours for this/these issue(s).  I discussed the assessment and treatment plan with the patient. The patient was provided an opportunity to ask questions and all were answered. The patient agreed with the plan and demonstrated an understanding of the instructions.   I spent 16 minutes on the date of this visit in the care of this patient. See summary of tasks completed to properly care for this patient in the detailed notes above which also included counseling of above, review of PMH, medications, allergies, evaluation of the patient and ordering and/or  instructing patient on testing and care options.     [name], DO

## 2022-03-20 NOTE — Patient Instructions (Signed)
HOME CARE TIPS:  -COVID19 testing information: [email or web address]  Most pharmacies also offer testing and home test kits. If the Covid19 test is positive and you desire antiviral treatment, please contact a [facility] or schedule a follow up virtual visit through your primary care office or through the [facility].  Other test to treat options: [email or web address]  -I sent the medication(s) we discussed to your pharmacy: Meds ordered this encounter  Medications   molnupiravir EUA (LAGEVRIO) 200 mg CAPS capsule    Sig: Take 4 capsules (800 mg total) by mouth 2 (two) times daily for 5 days.    Dispense:  40 capsule    Refill:  0   benzonatate (TESSALON PERLES) 100 MG capsule    Sig: Take 1 capsule (100 mg total) by mouth 3 (three) times daily as needed.    Dispense:  20 capsule    Refill:  0     -I sent in the [facility] treatment or referral you requested per our discussion. Please see the information provided below and discuss further with the pharmacist/treatment team.    -there is a chance of rebound illness with covid after improving. This can happen whether or not you take an antiviral treatment. If you become sick again with covid after getting better, please schedule a follow up virtual visit and isolate again.  -can use tylenol if needed for fevers, aches and pains per instructions  -nasal saline sinus rinses twice daily  -stay hydrated, drink plenty of fluids and eat small healthy meals - avoid dairy  -follow up with your doctor in 2-3 days unless improving and feeling better  -stay home while sick, except to seek medical care. If you have COVID19, you will likely be contagious for 7-10 days. Flu or Influenza is likely contagious for about 7 days. Other respiratory viral infections remain contagious for 5-10+ days depending on the virus and many other factors. Wear a good mask that fits snugly (such as  N95 or KN95) if around others to reduce the risk of transmission.  It was nice to meet you today, and I really hope you are feeling better soon. I help [facility] out with telemedicine visits on Tuesdays and Thursdays and am happy to help if you need a follow up virtual visit on those days. Otherwise, if you have any concerns or questions following this visit please schedule a follow up visit with your Primary Care doctor or seek care at a local urgent care clinic to avoid delays in care.    Seek in person care or schedule a follow up video visit promptly if your symptoms worsen, new concerns arise or you are not improving with treatment. Call 911 and/or seek emergency care if your symptoms are severe or life threatening.  PLEASE SEE THE FOLLOWING LINK FOR THE MOST UPDATED INFORMATION ABOUT LAGEVRIO:  [email or web address]      Fact Sheet for Patients And Caregivers Emergency Use Authorization (EUA) Of LAGEVRIOT (molnupiravir) capsules For Coronavirus Disease 2019 (COVID-19)  What is the most important information I should know about LAGEVRIO? LAGEVRIO may cause serious side effects, including: ? LAGEVRIO may cause harm to your unborn baby. It is not known if LAGEVRIO will harm your baby if you take LAGEVRIO during pregnancy. o LAGEVRIO is not recommended for use in pregnancy. o LAGEVRIO has not been studied in pregnancy. LAGEVRIO was studied in pregnant animals only. When LAGEVRIO was given to pregnant animals, LAGEVRIO caused harm to their unborn babies. o You and your healthcare provider may  decide that you should take LAGEVRIO during pregnancy if there are no other COVID-19 treatment options approved or authorized by the FDA that are accessible or clinically appropriate for you. o If you and your healthcare provider decide that you should take LAGEVRIO during pregnancy, you and your healthcare provider should discuss the known and potential benefits and the potential risks of  taking LAGEVRIO during pregnancy. For individuals who are able to become pregnant: ? You should use a reliable method of birth control (contraception) consistently and correctly during treatment with LAGEVRIO and for 4 days after the last dose of LAGEVRIO. Talk to your healthcare provider about reliable birth control methods. ? Before starting treatment with [facility] your healthcare provider may do a pregnancy test to see if you are pregnant before starting treatment with LAGEVRIO. ? Tell your healthcare provider right away if you become pregnant or think you may be pregnant during treatment with LAGEVRIO. Pregnancy Surveillance Program: ? There is a pregnancy surveillance program for individuals who take LAGEVRIO during pregnancy. The purpose of this program is to collect information about the health of you and your baby. Talk to your healthcare provider about how to take part in this program. ? If you take LAGEVRIO during pregnancy and you agree to participate in the pregnancy surveillance program and allow your healthcare provider to share your information with [place], then your healthcare provider will report your use of [place] during pregnancy to [place]. by calling [phone] or PeacefulBlog.es. For individuals who are sexually active with partners who are able to become pregnant: ? It is not known if LAGEVRIO can affect sperm. While the risk is regarded as low, animal studies to fully assess the potential for LAGEVRIO to affect the babies of males treated with LAGEVRIO have not been completed. A reliable method of birth control (contraception) should be used consistently and correctly during treatment with LAGEVRIO and for at least 3 months after the last dose. The risk to sperm beyond 3 months is not known. Studies to understand the risk to sperm beyond 3 months are ongoing. Talk to your healthcare provider about reliable birth control  methods. Talk to your healthcare provider if you have questions or concerns about how LAGEVRIO may affect sperm. You are being given this fact sheet because your healthcare provider believes it is necessary to provide you with LAGEVRIO for the treatment of adults with mild-to-moderate coronavirus disease 2019 (COVID-19) with positive results of direct SARS-CoV-2 viral testing, and who are at high risk for progression to severe COVID-19 including hospitalization or death, and for whom other COVID-19 treatment options approved or authorized by the FDA are not accessible or clinically appropriate. The U.S. Food and Drug Administration (FDA) has issued an Emergency Use Authorization (EUA) to make LAGEVRIO available during the COVID-19 pandemic (for more details about an EUA please see "What is an Emergency Use Authorization?" at the end of this document). LAGEVRIO is not an FDA-approved medicine in the [place]. Read this Fact Sheet for information about LAGEVRIO. Talk to your healthcare provider about your options if you have any questions. It is your choice to take LAGEVRIO.  What is COVID-19? COVID-19 is caused by a virus called a coronavirus. You can get COVID-19 through close contact with another person who has the virus. COVID-19 illnesses have ranged from very mild-to-severe, including illness resulting in death. While information so far suggests that most COVID-19 illness is mild, serious illness can happen and may cause  some of your other medical conditions to become worse. Older people and people of all ages with severe, long lasting (chronic) medical conditions like heart disease, lung disease and diabetes, for example seem to be at higher risk of being hospitalized for COVID-19.  What is LAGEVRIO? LAGEVRIO is an investigational medicine used to treat mild-to-moderate COVID-19 in adults: ? with positive results of direct SARS-CoV-2 viral testing, and ? who are at high risk for  progression to severe COVID-19 including hospitalization or death, and for whom other COVID-19 treatment options approved or authorized by the FDA are not accessible or clinically appropriate. The FDA has authorized the emergency use of LAGEVRIO for the treatment of mild-tomoderate COVID-19 in adults under an EUA. For more information on EUA, see the "What is an Emergency Use Authorization (EUA)?" section at the end of this Fact Sheet. LAGEVRIO is not authorized: ? for use in people less than 78 years of age. ? for prevention of COVID-19. ? for people needing hospitalization for COVID-19. ? for use for longer than 5 consecutive days.  What should I tell my healthcare provider before I take LAGEVRIO? Tell your healthcare provider if you: ? Have any allergies ? Are breastfeeding or plan to breastfeed ? Have any serious illnesses ? Are taking any medicines (prescription, over-the-counter, vitamins, or herbal products).  How do I take LAGEVRIO? ? Take LAGEVRIO exactly as your healthcare provider tells you to take it. ? Take 4 capsules of LAGEVRIO every 12 hours (for example, at 8 am and at 8 pm) ? Take LAGEVRIO for 5 days. It is important that you complete the full 5 days of treatment with LAGEVRIO. Do not stop taking LAGEVRIO before you complete the full 5 days of treatment, even if you feel better. ? Take LAGEVRIO with or without food. ? You should stay in isolation for as long as your healthcare provider tells you to. Talk to your healthcare provider if you are not sure about how to properly isolate while you have COVID-19. ? Swallow LAGEVRIO capsules whole. Do not open, break, or crush the capsules. If you cannot swallow capsules whole, tell your healthcare provider. ? What to do if you miss a dose: o If it has been less than 10 hours since the missed dose, take it as soon as you remember o If it has been more than 10 hours since the missed dose, skip the missed dose and take your  dose at the next scheduled time. ? Do not double the dose of LAGEVRIO to make up for a missed dose.  What are the important possible side effects of LAGEVRIO? ? See, "What is the most important information I should know about LAGEVRIO?" ? Allergic Reactions. Allergic reactions can happen in people taking LAGEVRIO, even after only 1 dose. Stop taking LAGEVRIO and call your healthcare provider right away if you get any of the following symptoms of an allergic reaction: o hives o rapid heartbeat o trouble swallowing or breathing o swelling of the mouth, lips, or face o throat tightness o hoarseness o skin rash The most common side effects of LAGEVRIO are: ? diarrhea ? nausea ? dizziness These are not all the possible side effects of LAGEVRIO. Not many people have taken LAGEVRIO. Serious and unexpected side effects may happen. This medicine is still being studied, so it is possible that all of the risks are not known at this time.  What other treatment choices are there?  Veklury (remdesivir) is FDA-approved as an intravenous (IV)  infusion for the treatment of mildto-moderate DHRCB-63 in certain adults and children. Talk with your doctor to see if Marijean Heath is appropriate for you. Like LAGEVRIO, FDA may also allow for the emergency use of other medicines to treat people with COVID-19. Go to LacrosseProperties.si for more information. It is your choice to be treated or not to be treated with LAGEVRIO. Should you decide not to take it, it will not change your standard medical care.  What if I am breastfeeding? Breastfeeding is not recommended during treatment with LAGEVRIO and for 4 days after the last dose of LAGEVRIO. If you are breastfeeding or plan to breastfeed, talk to your healthcare provider about your options and specific situation before taking LAGEVRIO.  How do I report side  effects with LAGEVRIO? Contact your healthcare provider if you have any side effects that bother you or do not go away. Report side effects to FDA MedWatch at SmoothHits.hu or call 1-800-FDA-1088 (1- [phone]).  How should I store [place]? ? Store LAGEVRIO capsules at room temperature between 83F to 70F (20C to 25C). ? Keep LAGEVRIO and all medicines out of the reach of children and pets. How can I learn more about COVID-19? ? Ask your healthcare provider. ? Visit SeekRooms.co.uk ? Contact your local or state public health department. ? Call [place] at [phone] (toll free in the [place] ? Visit www.molnupiravir.com  What Is an Emergency Use Authorization (EUA)? The [place] FDA has made [place] available under an emergency access mechanism called an Emergency Use Authorization (EUA) The EUA is supported by a Presenter, broadcasting Health and Human Service (HHS) declaration that circumstances exist to justify emergency use of drugs and biological products during the COVID-19 pandemic. LAGEVRIO for the treatment of mild-to-moderate COVID-19 in adults with positive results of direct SARS-CoV-2 viral testing, who are at high risk for progression to severe COVID-19, including hospitalization or death, and for whom alternative COVID-19 treatment options approved or authorized by FDA are not accessible or clinically appropriate, has not undergone the same type of review as an FDA-approved product. In issuing an EUA under the ZYYQM-25 public health emergency, the FDA has determined, among other things, that based on the total amount of scientific evidence available including data from adequate and well-controlled clinical trials, if available, it is reasonable to believe that the product may be effective for diagnosing, treating, or preventing COVID-19, or a serious or life-threatening disease or condition caused by COVID-19; that the known and potential benefits  of the product, when used to diagnose, treat, or prevent such disease or condition, outweigh the known and potential risks of such product; and that there are no adequate, approved, and available alternatives.  All of these criteria must be met to allow for the product to be used in the treatment of patients during the COVID-19 pandemic. The EUA for LAGEVRIO is in effect for the duration of the COVID-19 declaration justifying emergency use of LAGEVRIO, unless terminated or revoked (after which LAGEVRIO may no longer be used under the EUA). For patent information: http://rogers.info/ Copyright  2021-2022 [place], [place] and its affiliates. All rights reserved. [id number] Revised: March 2022

## 2022-03-28 ENCOUNTER — Telehealth (INDEPENDENT_AMBULATORY_CARE_PROVIDER_SITE_OTHER): Payer: Medicare HMO | Admitting: Internal Medicine

## 2022-03-28 ENCOUNTER — Encounter: Payer: Self-pay | Admitting: Internal Medicine

## 2022-03-28 DIAGNOSIS — J019 Acute sinusitis, unspecified: Secondary | ICD-10-CM | POA: Insufficient documentation

## 2022-03-28 DIAGNOSIS — J0100 Acute maxillary sinusitis, unspecified: Secondary | ICD-10-CM

## 2022-03-28 MED ORDER — AZITHROMYCIN 250 MG PO TABS: ORAL_TABLET | ORAL | 0 refills | Status: DC

## 2022-03-28 NOTE — Assessment & Plan Note (Signed)
Acute, post-COVID Given a Zpac Use a probiotic

## 2022-03-28 NOTE — Progress Notes (Signed)
Virtual Visit via Video Note  I connected with Molly Newman on 03/28/22 at 11:00 AM EST by a video enabled telemedicine application and verified that I am speaking with the correct person using two identifiers.   I discussed the limitations of evaluation and management by telemedicine and the availability of in person appointments. The patient expressed understanding and agreed to proceed.  I was located at our [facility] office. The patient was at home. There was no one else present in the visit.  No chief complaint on file.    History of Present Illness:  C/o sinus congestion x 2 days post-COVID C/o yellow d/c Review of Systems  Constitutional:  Negative for chills and fever.  HENT:  Positive for congestion and sinus pain.   Respiratory:  Negative for cough.   Cardiovascular:  Negative for leg swelling.  Musculoskeletal:  Negative for myalgias.  Skin:  Negative for rash.     Observations/Objective: The patient appears to be in no acute distress  Assessment and Plan:  Problem List Items Addressed This Visit     Acute sinusitis - Primary    Acute, post-COVID Given a Zpac Use a probiotic      Relevant Medications   azithromycin (ZITHROMAX Z-PAK) 250 MG tablet     Meds ordered this encounter  Medications   azithromycin (ZITHROMAX Z-PAK) 250 MG tablet    Sig: As directed    Dispense:  6 tablet    Refill:  0     Follow Up Instructions:    I discussed the assessment and treatment plan with the patient. The patient was provided an opportunity to ask questions and all were answered. The patient agreed with the plan and demonstrated an understanding of the instructions.   The patient was advised to call back or seek an in-person evaluation if the symptoms worsen or if the condition fails to improve as anticipated.  I provided face-to-face time during this encounter. We were at different locations.   Walker Kehr, MD

## 2022-04-02 ENCOUNTER — Other Ambulatory Visit: Payer: Self-pay | Admitting: Internal Medicine

## 2022-04-07 DIAGNOSIS — H524 Presbyopia: Secondary | ICD-10-CM | POA: Diagnosis not present

## 2022-04-07 DIAGNOSIS — H5213 Myopia, bilateral: Secondary | ICD-10-CM | POA: Diagnosis not present

## 2022-05-03 DIAGNOSIS — Z1231 Encounter for screening mammogram for malignant neoplasm of breast: Secondary | ICD-10-CM | POA: Diagnosis not present

## 2022-05-29 ENCOUNTER — Ambulatory Visit (INDEPENDENT_AMBULATORY_CARE_PROVIDER_SITE_OTHER): Payer: PPO | Admitting: Internal Medicine

## 2022-05-29 ENCOUNTER — Encounter: Payer: Self-pay | Admitting: Internal Medicine

## 2022-05-29 VITALS — BP 118/70 | HR 60 | Temp 98.2°F | Ht 68.0 in | Wt 189.0 lb

## 2022-05-29 DIAGNOSIS — N289 Disorder of kidney and ureter, unspecified: Secondary | ICD-10-CM | POA: Diagnosis not present

## 2022-05-29 DIAGNOSIS — E049 Nontoxic goiter, unspecified: Secondary | ICD-10-CM

## 2022-05-29 DIAGNOSIS — J301 Allergic rhinitis due to pollen: Secondary | ICD-10-CM | POA: Diagnosis not present

## 2022-05-29 DIAGNOSIS — Z8616 Personal history of COVID-19: Secondary | ICD-10-CM | POA: Diagnosis not present

## 2022-05-29 DIAGNOSIS — E785 Hyperlipidemia, unspecified: Secondary | ICD-10-CM

## 2022-05-29 LAB — URINALYSIS
Bilirubin Urine: NEGATIVE
Hgb urine dipstick: NEGATIVE
Ketones, ur: NEGATIVE
Leukocytes,Ua: NEGATIVE
Nitrite: NEGATIVE
Specific Gravity, Urine: 1.01 (ref 1.000–1.030)
Total Protein, Urine: NEGATIVE
Urine Glucose: NEGATIVE
Urobilinogen, UA: 0.2 (ref 0.0–1.0)
pH: 6 (ref 5.0–8.0)

## 2022-05-29 LAB — COMPREHENSIVE METABOLIC PANEL
ALT: 12 U/L (ref 0–35)
AST: 18 U/L (ref 0–37)
Albumin: 4 g/dL (ref 3.5–5.2)
Alkaline Phosphatase: 64 U/L (ref 39–117)
BUN: 17 mg/dL (ref 6–23)
CO2: 26 mEq/L (ref 19–32)
Calcium: 9.2 mg/dL (ref 8.4–10.5)
Chloride: 103 mEq/L (ref 96–112)
Creatinine, Ser: 0.99 mg/dL (ref 0.40–1.20)
GFR: 58.1 mL/min — ABNORMAL LOW (ref >60.00)
Glucose, Bld: 87 mg/dL (ref 70–99)
Potassium: 4.1 mEq/L (ref 3.5–5.1)
Sodium: 137 mEq/L (ref 135–145)
Total Bilirubin: 0.4 mg/dL (ref 0.2–1.2)
Total Protein: 7.4 g/dL (ref 6.0–8.3)

## 2022-05-29 LAB — CBC WITH DIFFERENTIAL/PLATELET
Basophils Absolute: 0.1 10*3/uL (ref 0.0–0.1)
Basophils Relative: 0.7 % (ref 0.0–3.0)
Eosinophils Absolute: 0.2 10*3/uL (ref 0.0–0.7)
Eosinophils Relative: 1.9 % (ref 0.0–5.0)
HCT: 32.9 % — ABNORMAL LOW (ref 36.0–46.0)
Hemoglobin: 10.8 g/dL — ABNORMAL LOW (ref 12.0–15.0)
Lymphocytes Relative: 33.1 % (ref 12.0–46.0)
Lymphs Abs: 2.8 10*3/uL (ref 0.7–4.0)
MCHC: 32.9 g/dL (ref 30.0–36.0)
MCV: 88.9 fl (ref 78.0–100.0)
Monocytes Absolute: 0.5 10*3/uL (ref 0.1–1.0)
Monocytes Relative: 6.2 % (ref 3.0–12.0)
Neutro Abs: 4.9 10*3/uL (ref 1.4–7.7)
Neutrophils Relative %: 58.1 % (ref 43.0–77.0)
Platelets: 257 10*3/uL (ref 150.0–400.0)
RBC: 3.7 Mil/uL — ABNORMAL LOW (ref 3.87–5.11)
RDW: 15 % (ref 11.5–15.5)
WBC: 8.5 10*3/uL (ref 4.0–10.5)

## 2022-05-29 LAB — LIPID PANEL
Cholesterol: 89 mg/dL (ref 0–200)
HDL: 44.4 mg/dL (ref >39.00)
LDL Cholesterol: 28 mg/dL (ref 0–99)
NonHDL: 44.92
Total CHOL/HDL Ratio: 2
Triglycerides: 84 mg/dL (ref 0.0–149.0)
VLDL: 16.8 mg/dL (ref 0.0–40.0)

## 2022-05-29 LAB — TSH: TSH: 2.73 u[IU]/mL (ref 0.35–5.50)

## 2022-05-29 MED ORDER — FEXOFENADINE HCL 180 MG PO TABS: 180.0000 mg | ORAL_TABLET | Freq: Every day | ORAL | 3 refills | Status: AC | PRN

## 2022-05-29 MED ORDER — CARVEDILOL 25 MG PO TABS: 25.0000 mg | ORAL_TABLET | Freq: Two times a day (BID) | ORAL | 3 refills | Status: DC

## 2022-05-29 MED ORDER — AMLODIPINE BESYLATE 10 MG PO TABS: ORAL_TABLET | ORAL | 3 refills | Status: DC

## 2022-05-29 NOTE — Assessment & Plan Note (Signed)
Monitor GFR 

## 2022-05-29 NOTE — Assessment & Plan Note (Signed)
Doing well 

## 2022-05-29 NOTE — Progress Notes (Signed)
Subjective:  Patient ID: Molly Newman, female    DOB: Aug 05, 1950  Age: 72 y.o. MRN: [id number]  CC: No chief complaint on file.   HPI Molly Newman presents for a well exam  Outpatient Medications Prior to Visit  Medication Sig Dispense Refill   azithromycin (ZITHROMAX Z-PAK) 250 MG tablet As directed 6 tablet 0   Cholecalciferol (VITAMIN D3) 1000 UNITS tablet Take 1,000 Units by mouth daily.     omeprazole (PRILOSEC) 40 MG capsule TAKE 1 CAPSULE EVERY DAY Annual appt due in January must see provider for future refills 90 capsule 0   tolterodine (DETROL LA) 4 MG 24 hr capsule Take 1 capsule (4 mg total) by mouth as needed. 90 capsule 3   amLODipine (NORVASC) 10 MG tablet TAKE 1 TABLET EVERY DAY Annual appt due in January must see provider for future refills 90 tablet 0   carvedilol (COREG) 25 MG tablet Take 1 tablet (25 mg total) by mouth 2 (two) times daily with a meal. Annual appt due in January must see provider for future refills 180 tablet 0   fexofenadine (ALLEGRA) 180 MG tablet Take 180 mg by mouth daily as needed for allergies or rhinitis.     benzonatate (TESSALON PERLES) 100 MG capsule Take 1 capsule (100 mg total) by mouth 3 (three) times daily as needed. (Patient not taking: Reported on 05/29/2022) 20 capsule 0   No facility-administered medications prior to visit.    ROS: Review of Systems  Constitutional:  Negative for activity change, appetite change, chills, fatigue and unexpected weight change.  HENT:  Positive for congestion and rhinorrhea. Negative for mouth sores and sinus pressure.   Eyes:  Negative for visual disturbance.  Respiratory:  Negative for cough and chest tightness.   Gastrointestinal:  Negative for abdominal pain and nausea.  Genitourinary:  Negative for difficulty urinating, frequency and vaginal pain.  Musculoskeletal:  Negative for back pain and gait problem.  Skin:  Negative for pallor and rash.  Neurological:  Negative for  dizziness, tremors, weakness, numbness and headaches.  Psychiatric/Behavioral:  Negative for confusion and sleep disturbance.     Objective:  BP 118/70 (BP Location: Left Arm, Patient Position: Sitting, Cuff Size: Normal)   Pulse 60   Temp 98.2 F (36.8 C) (Oral)   Ht '5\' 8"'$  (1.727 m)   Wt 189 lb (85.7 kg)   SpO2 96%   BMI 28.74 kg/m   BP Readings from Last 3 Encounters:  05/29/22 118/70  05/09/21 122/76  04/18/20 120/82    Wt Readings from Last 3 Encounters:  05/29/22 189 lb (85.7 kg)  05/09/21 198 lb 3.2 oz (89.9 kg)  04/18/20 203 lb 6.4 oz (92.3 kg)    Physical Exam Constitutional:      General: She is not in acute distress.    Appearance: She is well-developed. She is obese.  HENT:     Head: Normocephalic.     Right Ear: External ear normal.     Left Ear: External ear normal.     Nose: Nose normal.  Eyes:     General:        Right eye: No discharge.        Left eye: No discharge.     Conjunctiva/sclera: Conjunctivae normal.     Pupils: Pupils are equal, round, and reactive to light.  Neck:     Thyroid: No thyromegaly.     Vascular: No JVD.     Trachea: No tracheal deviation.  Cardiovascular:  Rate and Rhythm: Normal rate and regular rhythm.     Heart sounds: Normal heart sounds.  Pulmonary:     Effort: No respiratory distress.     Breath sounds: No stridor. No wheezing.  Abdominal:     General: Bowel sounds are normal. There is no distension.     Palpations: Abdomen is soft. There is no mass.     Tenderness: There is no abdominal tenderness. There is no guarding or rebound.  Musculoskeletal:        General: No tenderness.     Cervical back: Normal range of motion and neck supple. No rigidity.  Lymphadenopathy:     Cervical: No cervical adenopathy.  Skin:    Findings: No erythema or rash.  Neurological:     Cranial Nerves: No cranial nerve deficit.     Motor: No abnormal muscle tone.     Coordination: Coordination normal.     Deep Tendon  Reflexes: Reflexes normal.  Psychiatric:        Behavior: Behavior normal.        Thought Content: Thought content normal.        Judgment: Judgment normal.     Lab Results  Component Value Date   WBC 7.6 11/02/2019   HGB 11.2 (L) 11/02/2019   HCT 33.9 (L) 11/02/2019   PLT 258.0 11/02/2019   GLUCOSE 89 04/18/2020   CHOL 92 04/18/2020   TRIG 71.0 04/18/2020   HDL 48.50 04/18/2020   LDLCALC 29 04/18/2020   ALT 14 04/18/2020   AST 18 04/18/2020   NA 137 04/18/2020   K 4.2 04/18/2020   CL 102 04/18/2020   CREATININE 1.00 04/18/2020   BUN 17 04/18/2020   CO2 28 04/18/2020   TSH 2.74 04/18/2020   HGBA1C 6.1 04/15/2019    US RENAL  Result Date: 05/11/2020 CLINICAL DATA:  Decreased GFR Recurring UTIs EXAM: RENAL / URINARY TRACT ULTRASOUND COMPLETE COMPARISON:  None. FINDINGS: Right Kidney: Renal measurements: 9.5 x 3.4 x 5.4 = volume: 92 mL. Echogenicity within normal limits. No mass or hydronephrosis visualized. Left Kidney: Renal measurements: 9.3 x 4.6 x 5.5 = volume: 123 mL. Echogenicity within normal limits. No mass or hydronephrosis visualized. Bladder: Evaluation of the bladder is limited due to under distension. No significant abnormality identified. Bilateral ureteral jets are noted. Other: None. IMPRESSION: No significant abnormality of the kidneys or bladder. Electronically Signed   By: Miachel Roux M.D.   On: 05/11/2020 13:24    Assessment & Plan:   Problem List Items Addressed This Visit       Respiratory   Allergic rhinitis    Doing well        Endocrine   Goiter    Check TSH      Relevant Medications   carvedilol (COREG) 25 MG tablet   Other Relevant Orders   TSH   Urinalysis   CBC with Differential/Platelet   Lipid panel   Comprehensive metabolic panel     Genitourinary   Renal insufficiency - Primary    Monitor GFR      Relevant Orders   TSH   Urinalysis   CBC with Differential/Platelet   Lipid panel   Comprehensive metabolic panel      Other   History of COVID-19   Other Visit Diagnoses     Dyslipidemia       Relevant Orders   TSH   Lipid panel         Meds ordered this encounter  Medications  amLODipine (NORVASC) 10 MG tablet    Sig: TAKE 1 TABLET EVERY DAY Annual appt due in January must see provider for future refills    Dispense:  90 tablet    Refill:  3   carvedilol (COREG) 25 MG tablet    Sig: Take 1 tablet (25 mg total) by mouth 2 (two) times daily with a meal. Annual appt due in January must see provider for future refills    Dispense:  180 tablet    Refill:  3   fexofenadine (ALLEGRA) 180 MG tablet    Sig: Take 1 tablet (180 mg total) by mouth daily as needed for allergies or rhinitis.    Dispense:  90 tablet    Refill:  3      Follow-up: Return in about 1 year (around 05/30/2023) for Wellness Exam.  Walker Kehr, MD

## 2022-05-29 NOTE — Assessment & Plan Note (Signed)
Check TSH 

## 2022-06-04 ENCOUNTER — Telehealth: Payer: Self-pay

## 2022-06-04 NOTE — Telephone Encounter (Signed)
Made pt aware of results per providers notes and provider wanting her to keep hydrating

## 2022-06-12 DIAGNOSIS — D259 Leiomyoma of uterus, unspecified: Secondary | ICD-10-CM | POA: Diagnosis not present

## 2022-06-12 DIAGNOSIS — Z124 Encounter for screening for malignant neoplasm of cervix: Secondary | ICD-10-CM | POA: Diagnosis not present

## 2022-06-12 DIAGNOSIS — R7303 Prediabetes: Secondary | ICD-10-CM | POA: Diagnosis not present

## 2022-06-12 DIAGNOSIS — Z01419 Encounter for gynecological examination (general) (routine) without abnormal findings: Secondary | ICD-10-CM | POA: Diagnosis not present

## 2022-06-12 DIAGNOSIS — Z803 Family history of malignant neoplasm of breast: Secondary | ICD-10-CM | POA: Diagnosis not present

## 2022-07-25 ENCOUNTER — Telehealth: Payer: Self-pay | Admitting: Internal Medicine

## 2022-07-25 NOTE — Telephone Encounter (Signed)
Called patient to schedule Medicare Annual Wellness Visit (AWV). Left message for patient to call back and schedule Medicare Annual Wellness Visit (AWV).  Last date of AWV: * due 09/05/18 awvi  Please schedule an appointment at any time with [facility].  If any questions, please contact me at [phone].  Thank you ,  Barkley Boards AWV direct phone # [phone]

## 2022-08-02 NOTE — Telephone Encounter (Signed)
Contacted Molly Newman to schedule their annual wellness visit. Appointment made for 08/10/22.  Molly Newman AWV direct phone # [phone]

## 2022-08-10 ENCOUNTER — Ambulatory Visit: Payer: HMO

## 2022-08-14 ENCOUNTER — Telehealth: Payer: Self-pay | Admitting: Internal Medicine

## 2022-08-14 MED ORDER — OMEPRAZOLE 40 MG PO CPDR: DELAYED_RELEASE_CAPSULE | ORAL | 2 refills | Status: DC

## 2022-08-14 MED ORDER — AMLODIPINE BESYLATE 10 MG PO TABS: ORAL_TABLET | ORAL | 2 refills | Status: DC

## 2022-08-14 MED ORDER — CARVEDILOL 25 MG PO TABS: 25.0000 mg | ORAL_TABLET | Freq: Two times a day (BID) | ORAL | 2 refills | Status: DC

## 2022-08-14 NOTE — Telephone Encounter (Signed)
Sent refills to [email or web address] Chute

## 2022-08-14 NOTE — Telephone Encounter (Signed)
Prescription Request  08/14/2022  LOV: 05/29/2022  What is the name of the medication or equipment? amLODipine (NORVASC) 10 MG tablet  omeprazole (PRILOSEC) 40 MG capsule  carvedilol (COREG) 25 MG tablet  Have you contacted your pharmacy to request a refill? No   Which pharmacy would you like this sent to?   [facility] - [place], [place] - [address]   Patient notified that their request is being sent to the clinical staff for review and that they should receive a response within 2 business days.   Please advise at Mobile [phone] (mobile)

## 2022-08-22 ENCOUNTER — Ambulatory Visit: Payer: Medicare HMO

## 2022-08-22 VITALS — Ht 68.0 in | Wt 189.0 lb

## 2022-08-22 DIAGNOSIS — Z1211 Encounter for screening for malignant neoplasm of colon: Secondary | ICD-10-CM

## 2022-08-22 DIAGNOSIS — Z0000 Encounter for general adult medical examination without abnormal findings: Secondary | ICD-10-CM | POA: Diagnosis not present

## 2022-08-22 NOTE — Patient Instructions (Addendum)
Molly Newman , Thank you for taking time to come for your Medicare Wellness Visit. I appreciate your ongoing commitment to your health goals. Please review the following plan we discussed and let me know if I can assist you in the future.   These are the goals we discussed:  Goals      My goal for 2024 is to stay healthy and continue to change my diet and increasing fiber.        This is a list of the screening recommended for you and due dates:  Health Maintenance  Topic Date Due   Zoster (Shingles) Vaccine (1 of 2) Never done   Pneumonia Vaccine (1 of 1 - PCV) Never done   DEXA scan (bone density measurement)  Never done   Colon Cancer Screening  08/19/2020   COVID-19 Vaccine (6 - 2023-24 season) 04/25/2022   Flu Shot  12/06/2022   Mammogram  05/04/2023   Medicare Annual Wellness Visit  08/22/2023   DTaP/Tdap/Td vaccine (2 - Td or Tdap) 01/02/2025   Hepatitis C Screening: USPSTF Recommendation to screen - Ages 18-79 yo.  Completed   HPV Vaccine  Aged Out    Advanced directives: No; Advance directive discussed with you today. I have provided a copy for you to complete at home and have notarized. Once this is complete please bring a copy in to our office so we can scan it into your chart.  Conditions/risks identified: Yes  Next appointment: Follow up in one year for your annual wellness visit.   Preventive Care 70 Years and Older, Female Preventive care refers to lifestyle choices and visits with your health care provider that can promote health and wellness. What does preventive care include? A yearly physical exam. This is also called an annual well check. Dental exams once or twice a year. Routine eye exams. Ask your health care provider how often you should have your eyes checked. Personal lifestyle choices, including: Daily care of your teeth and gums. Regular physical activity. Eating a healthy diet. Avoiding tobacco and drug use. Limiting alcohol use. Practicing  safe sex. Taking low-dose aspirin every day. Taking vitamin and mineral supplements as recommended by your health care provider. What happens during an annual well check? The services and screenings done by your health care provider during your annual well check will depend on your age, overall health, lifestyle risk factors, and family history of disease. Counseling  Your health care provider may ask you questions about your: Alcohol use. Tobacco use. Drug use. Emotional well-being. Home and relationship well-being. Sexual activity. Eating habits. History of falls. Memory and ability to understand (cognition). Work and work Astronomer. Reproductive health. Screening  You may have the following tests or measurements: Height, weight, and BMI. Blood pressure. Lipid and cholesterol levels. These may be checked every 5 years, or more frequently if you are over 21 years old. Skin check. Lung cancer screening. You may have this screening every year starting at age 70 if you have a 30-pack-year history of smoking and currently smoke or have quit within the past 15 years. Fecal occult blood test (FOBT) of the stool. You may have this test every year starting at age 70. Flexible sigmoidoscopy or colonoscopy. You may have a sigmoidoscopy every 5 years or a colonoscopy every 10 years starting at age 70. Hepatitis C blood test. Hepatitis B blood test. Sexually transmitted disease (STD) testing. Diabetes screening. This is done by checking your blood sugar (glucose) after you have not eaten  for a while (fasting). You may have this done every 1-3 years. Bone density scan. This is done to screen for osteoporosis. You may have this done starting at age 70. Mammogram. This may be done every 1-2 years. Talk to your health care provider about how often you should have regular mammograms. Talk with your health care provider about your test results, treatment options, and if necessary, the need for more  tests. Vaccines  Your health care provider may recommend certain vaccines, such as: Influenza vaccine. This is recommended every year. Tetanus, diphtheria, and acellular pertussis (Tdap, Td) vaccine. You may need a Td booster every 10 years. Zoster vaccine. You may need this after age 70. Pneumococcal 13-valent conjugate (PCV13) vaccine. One dose is recommended after age 70. Pneumococcal polysaccharide (PPSV23) vaccine. One dose is recommended after age 70. Talk to your health care provider about which screenings and vaccines you need and how often you need them. This information is not intended to replace advice given to you by your health care provider. Make sure you discuss any questions you have with your health care provider. Document Released: 05/20/2015 Document Revised: 01/11/2016 Document Reviewed: 02/22/2015 Elsevier Interactive Patient Education  2017 [place] Prevention in the Home Falls can cause injuries. They can happen to people of all ages. There are many things you can do to make your home safe and to help prevent falls. What can I do on the outside of my home? Regularly fix the edges of walkways and driveways and fix any cracks. Remove anything that might make you trip as you walk through a door, such as a raised step or threshold. Trim any bushes or trees on the path to your home. Use bright outdoor lighting. Clear any walking paths of anything that might make someone trip, such as rocks or tools. Regularly check to see if handrails are loose or broken. Make sure that both sides of any steps have handrails. Any raised decks and porches should have guardrails on the edges. Have any leaves, snow, or ice cleared regularly. Use sand or salt on walking paths during winter. Clean up any spills in your garage right away. This includes oil or grease spills. What can I do in the bathroom? Use night lights. Install grab bars by the toilet and in the tub and shower.  Do not use towel bars as grab bars. Use non-skid mats or decals in the tub or shower. If you need to sit down in the shower, use a plastic, non-slip stool. Keep the floor dry. Clean up any water that spills on the floor as soon as it happens. Remove soap buildup in the tub or shower regularly. Attach bath mats securely with double-sided non-slip rug tape. Do not have throw rugs and other things on the floor that can make you trip. What can I do in the bedroom? Use night lights. Make sure that you have a light by your bed that is easy to reach. Do not use any sheets or blankets that are too big for your bed. They should not hang down onto the floor. Have a firm chair that has side arms. You can use this for support while you get dressed. Do not have throw rugs and other things on the floor that can make you trip. What can I do in the kitchen? Clean up any spills right away. Avoid walking on wet floors. Keep items that you use a lot in easy-to-reach places. If you need to reach something  above you, use a strong step stool that has a grab bar. Keep electrical cords out of the way. Do not use floor polish or wax that makes floors slippery. If you must use wax, use non-skid floor wax. Do not have throw rugs and other things on the floor that can make you trip. What can I do with my stairs? Do not leave any items on the stairs. Make sure that there are handrails on both sides of the stairs and use them. Fix handrails that are broken or loose. Make sure that handrails are as long as the stairways. Check any carpeting to make sure that it is firmly attached to the stairs. Fix any carpet that is loose or worn. Avoid having throw rugs at the top or bottom of the stairs. If you do have throw rugs, attach them to the floor with carpet tape. Make sure that you have a light switch at the top of the stairs and the bottom of the stairs. If you do not have them, ask someone to add them for you. What else  can I do to help prevent falls? Wear shoes that: Do not have high heels. Have rubber bottoms. Are comfortable and fit you well. Are closed at the toe. Do not wear sandals. If you use a stepladder: Make sure that it is fully opened. Do not climb a closed stepladder. Make sure that both sides of the stepladder are locked into place. Ask someone to hold it for you, if possible. Clearly mark and make sure that you can see: Any grab bars or handrails. First and last steps. Where the edge of each step is. Use tools that help you move around (mobility aids) if they are needed. These include: Canes. Walkers. Scooters. Crutches. Turn on the lights when you go into a dark area. Replace any light bulbs as soon as they burn out. Set up your furniture so you have a clear path. Avoid moving your furniture around. If any of your floors are uneven, fix them. If there are any pets around you, be aware of where they are. Review your medicines with your doctor. Some medicines can make you feel dizzy. This can increase your chance of falling. Ask your doctor what other things that you can do to help prevent falls. This information is not intended to replace advice given to you by your health care provider. Make sure you discuss any questions you have with your health care provider. Document Released: 02/17/2009 Document Revised: 09/29/2015 Document Reviewed: 05/28/2014 Elsevier Interactive Patient Education  2017 [employer].

## 2022-08-22 NOTE — Progress Notes (Addendum)
I connected with  Lawernce Keas on 08/22/22 by a audio enabled telemedicine application and verified that I am speaking with the correct person using two identifiers.  Patient Location: Home  Provider Location: Office/Clinic  I discussed the limitations of evaluation and management by telemedicine. The patient expressed understanding and agreed to proceed.  Subjective:   Molly Newman is a 72 y.o. female who presents for an Initial Medicare Annual Wellness Visit.  Review of Systems     Cardiac Risk Factors include: advanced age (>68men, >29 women);hypertension;sedentary lifestyle;family history of premature cardiovascular disease     Objective:    Today's Vitals   08/22/22 1416  Weight: 189 lb (85.7 kg)  Height: 5\' 8"  (1.727 m)  PainSc: 0-No pain   Body mass index is 28.74 kg/m.     08/22/2022    2:19 PM 08/19/2017    7:34 AM  Advanced Directives  Does Patient Have a Medical Advance Directive? No No  Would patient like information on creating a medical advance directive? Yes (MAU/Ambulatory/Procedural Areas - Information given) No - Patient declined    Current Medications (verified) Outpatient Encounter Medications as of 08/22/2022  Medication Sig   amLODipine (NORVASC) 10 MG tablet TAKE 1 TABLET EVERY DAY Annual appt due in January must see provider for future refills   carvedilol (COREG) 25 MG tablet Take 1 tablet (25 mg total) by mouth 2 (two) times daily with a meal.   Cholecalciferol (VITAMIN D3) 1000 UNITS tablet Take 1,000 Units by mouth daily.   fexofenadine (ALLEGRA) 180 MG tablet Take 1 tablet (180 mg total) by mouth daily as needed for allergies or rhinitis.   omeprazole (PRILOSEC) 40 MG capsule TAKE 1 CAPSULE EVERY DAY Annual appt due in January must see provider for future refills   tolterodine (DETROL LA) 4 MG 24 hr capsule Take 1 capsule (4 mg total) by mouth as needed.   azithromycin (ZITHROMAX Z-PAK) 250 MG tablet As directed (Patient not  taking: Reported on 08/22/2022)   benzonatate (TESSALON PERLES) 100 MG capsule Take 1 capsule (100 mg total) by mouth 3 (three) times daily as needed. (Patient not taking: Reported on 05/29/2022)   No facility-administered encounter medications on file as of 08/22/2022.    Allergies (verified) Ace inhibitors and Diltiazem hcl   History: Past Medical History:  Diagnosis Date   Allergy    Cancer    30 years ago   GERD (gastroesophageal reflux disease)    Heart murmur    as a child   Hypertension    History reviewed. No pertinent surgical history. Family History  Problem Relation Age of Onset   Hypertension Other    Colon polyps Brother    Stroke Sister    Colon cancer Neg Hx    Esophageal cancer Neg Hx    Rectal cancer Neg Hx    Stomach cancer Neg Hx    Social History   Socioeconomic History   Marital status: Single    Spouse name: Not on file   Number of children: Not on file   Years of education: Not on file   Highest education level: Not on file  Occupational History   Not on file  Tobacco Use   Smoking status: Never   Smokeless tobacco: Never  Vaping Use   Vaping Use: Never used  Substance and Sexual Activity   Alcohol use: Yes    Comment: occasionally   Drug use: No   Sexual activity: Not on file  Other Topics  Concern   Not on file  Social History Narrative   Not on file   Social Determinants of Health   Financial Resource Strain: Low Risk  (08/22/2022)   Overall Financial Resource Strain (CARDIA)    Difficulty of Paying Living Expenses: Not hard at all  Food Insecurity: No Food Insecurity (08/22/2022)   Hunger Vital Sign    Worried About Running Out of Food in the Last Year: Never true    Ran Out of Food in the Last Year: Never true  Transportation Needs: No Transportation Needs (08/22/2022)   PRAPARE - Administrator, Civil Service (Medical): No    Lack of Transportation (Non-Medical): No  Physical Activity: Insufficiently Active  (08/22/2022)   Exercise Vital Sign    Days of Exercise per Week: 3 days    Minutes of Exercise per Session: 30 min  Stress: No Stress Concern Present (08/22/2022)   [employer] of Occupational Health - Occupational Stress Questionnaire    Feeling of Stress : Not at all  Social Connections: Moderately Integrated (08/22/2022)   Social Connection and Isolation Panel [NHANES]    Frequency of Communication with Friends and Family: More than three times a week    Frequency of Social Gatherings with Friends and Family: More than three times a week    Attends Religious Services: More than 4 times per year    Active Member of [employer] or Organizations: Yes    Attends Engineer, structural: More than 4 times per year    Marital Status: Never married    Tobacco Counseling Counseling given: Not Answered   Clinical Intake:  Pre-visit preparation completed: Yes  Pain : No/denies pain Pain Score: 0-No pain     BMI - recorded: 28.74 Nutritional Status: BMI 25 -29 Overweight Nutritional Risks: None Diabetes: No  How often do you need to have someone help you when you read instructions, pamphlets, or other written materials from your doctor or pharmacy?: 1 - Never What is the last grade level you completed in school?: [employer] for 1 year  Diabetic? No  Interpreter Needed?: No  Information entered by :: Susie Cassette, LPN.   Activities of Daily Living    08/22/2022    2:23 PM  In your present state of health, do you have any difficulty performing the following activities:  Hearing? 0  Vision? 0  Difficulty concentrating or making decisions? 0  Walking or climbing stairs? 0  Dressing or bathing? 0  Doing errands, shopping? 0  Preparing Food and eating ? N  Using the Toilet? N  In the past six months, have you accidently leaked urine? N  Do you have problems with loss of bowel control? N  Managing your Medications? N  Managing your Finances? N  Housekeeping  or managing your Housekeeping? N    Patient Care Team: Plotnikov, Georgina Quint, MD as PCP - Lelon Mast, MD (Inactive) as Attending Physician (Obstetrics and Gynecology) Romero Belling, MD (Inactive) as Attending Physician (Internal Medicine) Pyrtle, Carie Caddy, MD as Consulting Physician (Gastroenterology)  Indicate any recent Medical Services you may have received from other than [facility] providers in the past year (date may be approximate).     Assessment:   This is a routine wellness examination for [place].  Hearing/Vision screen Hearing Screening - Comments:: Denies hearing difficulties   Vision Screening - Comments:: Wears rx glasses - up to date with routine eye exams with [facility].   Dietary issues and  exercise activities discussed: Current Exercise Habits: Home exercise routine, Type of exercise: walking, Time (Minutes): 30, Frequency (Times/Week): 3, Weekly Exercise (Minutes/Week): 90, Intensity: Moderate, Exercise limited by: None identified   Goals Addressed             This Visit's Progress    My goal for 2024 is to stay healthy and continue to change my diet and increasing fiber.        Depression Screen    08/22/2022    2:21 PM 05/29/2022    2:34 PM 05/09/2021    1:49 PM 04/18/2020   10:08 AM  PHQ 2/9 Scores  PHQ - 2 Score 0 0 0 0  PHQ- 9 Score 1  0     Fall Risk    08/22/2022    2:21 PM 05/29/2022    2:34 PM 05/09/2021    1:49 PM 04/18/2020   10:08 AM  Fall Risk   Falls in the past year? 0 0 0 0  Number falls in past yr: 0 0 0 0  Injury with Fall? 0 0 0 0  Risk for fall due to : No Fall Risks No Fall Risks No Fall Risks   Follow up Falls prevention discussed Falls evaluation completed      FALL RISK PREVENTION PERTAINING TO THE HOME:  Any stairs in or around the home? No  If so, are there any without handrails? No  Home free of loose throw rugs in walkways, pet beds, electrical cords, etc? Yes  Adequate lighting in your home to reduce risk  of falls? Yes   ASSISTIVE DEVICES UTILIZED TO PREVENT FALLS:  Life alert? No  Use of a cane, walker or w/c? No  Grab bars in the bathroom? No  Shower chair or bench in shower? No  Elevated toilet seat or a handicapped toilet? Yes   TIMED UP AND GO:  Was the test performed? No . Telephonic Visit   Cognitive Function:        08/22/2022    2:21 PM  6CIT Screen  What Year? 0 points  What month? 0 points  What time? 0 points  Count back from 20 0 points  Months in reverse 0 points  Repeat phrase 0 points  Total Score 0 points    Immunizations Immunization History  Administered Date(s) Administered   Fluad Quad(high Dose 65+) 02/25/2020, 03/01/2021   Influenza, High Dose Seasonal PF 03/24/2019   Influenza,inj,Quad PF,6+ Mos 07/05/2015, 01/10/2017, 02/20/2018   Influenza-Unspecified 06/29/2016   PFIZER Comirnaty(Gray Top)Covid-19 Tri-Sucrose Vaccine 02/28/2022   PFIZER(Purple Top)SARS-COV-2 Vaccination 07/09/2019, 08/05/2019, 05/04/2020, 12/30/2020   Tdap 01/03/2015    TDAP status: Up to date  Flu Vaccine status: Up to date  Pneumococcal vaccine status: Due, Education has been provided regarding the importance of this vaccine. Advised may receive this vaccine at local pharmacy or Health Dept. Aware to provide a copy of the vaccination record if obtained from local pharmacy or Health Dept. Verbalized acceptance and understanding.  Covid-19 vaccine status: Completed vaccines  Qualifies for Shingles Vaccine? Yes   Zostavax completed No   Shingrix Completed?: No.    Education has been provided regarding the importance of this vaccine. Patient has been advised to call insurance company to determine out of pocket expense if they have not yet received this vaccine. Advised may also receive vaccine at local pharmacy or Health Dept. Verbalized acceptance and understanding.  Screening Tests Health Maintenance  Topic Date Due   Zoster Vaccines- Shingrix (1 of 2)  Never done    Pneumonia Vaccine 3+ Years old (1 of 1 - PCV) Never done   DEXA SCAN  Never done   COLONOSCOPY (Pts 45-24yrs Insurance coverage will need to be confirmed)  08/19/2020   COVID-19 Vaccine (6 - 2023-24 season) 04/25/2022   INFLUENZA VACCINE  12/06/2022   MAMMOGRAM  05/04/2023   Medicare Annual Wellness (AWV)  08/22/2023   DTaP/Tdap/Td (2 - Td or Tdap) 01/02/2025   Hepatitis C Screening  Completed   HPV VACCINES  Aged Out    Health Maintenance  Health Maintenance Due  Topic Date Due   Zoster Vaccines- Shingrix (1 of 2) Never done   Pneumonia Vaccine 1+ Years old (1 of 1 - PCV) Never done   DEXA SCAN  Never done   COLONOSCOPY (Pts 45-10yrs Insurance coverage will need to be confirmed)  08/19/2020   COVID-19 Vaccine (6 - 2023-24 season) 04/25/2022    Colorectal cancer screening: Referral to GI placed 08/22/2022. Pt aware the office will call re: appt.  Mammogram status: Completed 05/03/2022. Repeat every year  Bone Density Scan: No record from [facility].  Lung Cancer Screening: (Low Dose CT Chest recommended if Age 62-80 years, 30 pack-year currently smoking OR have quit w/in 15years.) does not qualify.   Lung Cancer Screening Referral:  NO  Additional Screening:  Hepatitis C Screening: does qualify; Completed 01/02/2016  Vision Screening: Recommended annual ophthalmology exams for early detection of glaucoma and other disorders of the eye. Is the patient up to date with their annual eye exam?  Yes  Who is the provider or what is the name of the office in which the patient attends annual eye exams? [facility], [place]. If pt is not established with a provider, would they like to be referred to a provider to establish care? No .   Dental Screening: Recommended annual dental exams for proper oral hygiene  Community Resource Referral / Chronic Care Management: CRR required this visit?  No   CCM required this visit?  No      Plan:     I have personally reviewed  and noted the following in the patient's chart:   Medical and social history Use of alcohol, tobacco or illicit drugs  Current medications and supplements including opioid prescriptions. Patient is not currently taking opioid prescriptions. Functional ability and status Nutritional status Physical activity Advanced directives List of other physicians Hospitalizations, surgeries, and ER visits in previous 12 months Vitals Screenings to include cognitive, depression, and falls Referrals and appointments  In addition, I have reviewed and discussed with patient certain preventive protocols, quality metrics, and best practice recommendations. A written personalized care plan for preventive services as well as general preventive health recommendations were provided to patient.     Mickeal Needy, LPN   1/61/0960   Nurse Notes: Normal cognitive status assessed by direct observation via phone by this Nurse Health Advisor. No abnormalities found.    Medical screening examination/treatment/procedure(s) were performed by non-physician practitioner and as supervising physician I was immediately available for consultation/collaboration.  I agree with above. Jacinta Shoe, MD

## 2022-10-16 ENCOUNTER — Encounter: Payer: Self-pay | Admitting: Internal Medicine

## 2022-12-10 ENCOUNTER — Encounter: Payer: Medicare HMO | Admitting: Internal Medicine

## 2023-01-16 ENCOUNTER — Encounter: Payer: Medicare HMO | Admitting: Internal Medicine

## 2023-04-02 ENCOUNTER — Other Ambulatory Visit: Payer: Self-pay | Admitting: Internal Medicine

## 2023-04-16 DIAGNOSIS — H524 Presbyopia: Secondary | ICD-10-CM | POA: Diagnosis not present

## 2023-04-16 DIAGNOSIS — H5213 Myopia, bilateral: Secondary | ICD-10-CM | POA: Diagnosis not present

## 2023-05-06 DIAGNOSIS — Z1231 Encounter for screening mammogram for malignant neoplasm of breast: Secondary | ICD-10-CM | POA: Diagnosis not present

## 2023-06-05 ENCOUNTER — Encounter: Payer: Self-pay | Admitting: Internal Medicine

## 2023-06-05 ENCOUNTER — Ambulatory Visit: Payer: Medicare HMO | Admitting: Internal Medicine

## 2023-06-05 VITALS — Ht 68.0 in

## 2023-06-05 DIAGNOSIS — I10 Essential (primary) hypertension: Secondary | ICD-10-CM

## 2023-06-05 DIAGNOSIS — N289 Disorder of kidney and ureter, unspecified: Secondary | ICD-10-CM

## 2023-06-05 DIAGNOSIS — Z0000 Encounter for general adult medical examination without abnormal findings: Secondary | ICD-10-CM | POA: Diagnosis not present

## 2023-06-05 DIAGNOSIS — D126 Benign neoplasm of colon, unspecified: Secondary | ICD-10-CM

## 2023-06-05 DIAGNOSIS — E785 Hyperlipidemia, unspecified: Secondary | ICD-10-CM | POA: Diagnosis not present

## 2023-06-05 DIAGNOSIS — K635 Polyp of colon: Secondary | ICD-10-CM | POA: Insufficient documentation

## 2023-06-05 LAB — URINALYSIS, ROUTINE W REFLEX MICROSCOPIC
Bilirubin Urine: NEGATIVE
Hgb urine dipstick: NEGATIVE
Ketones, ur: NEGATIVE
Nitrite: NEGATIVE
Specific Gravity, Urine: 1.01 (ref 1.000–1.030)
Total Protein, Urine: NEGATIVE
Urine Glucose: NEGATIVE
Urobilinogen, UA: 0.2 (ref 0.0–1.0)
pH: 7 (ref 5.0–8.0)

## 2023-06-05 LAB — CBC WITH DIFFERENTIAL/PLATELET
Basophils Absolute: 0.1 10*3/uL (ref 0.0–0.1)
Basophils Relative: 1 % (ref 0.0–3.0)
Eosinophils Absolute: 0.1 10*3/uL (ref 0.0–0.7)
Eosinophils Relative: 1.5 % (ref 0.0–5.0)
HCT: 38.1 % (ref 36.0–46.0)
Hemoglobin: 12.3 g/dL (ref 12.0–15.0)
Lymphocytes Relative: 39.1 % (ref 12.0–46.0)
Lymphs Abs: 2.8 10*3/uL (ref 0.7–4.0)
MCHC: 32.2 g/dL (ref 30.0–36.0)
MCV: 91.4 fL (ref 78.0–100.0)
Monocytes Absolute: 0.4 10*3/uL (ref 0.1–1.0)
Monocytes Relative: 5.9 % (ref 3.0–12.0)
Neutro Abs: 3.7 10*3/uL (ref 1.4–7.7)
Neutrophils Relative %: 52.5 % (ref 43.0–77.0)
Platelets: 231 10*3/uL (ref 150.0–400.0)
RBC: 4.16 Mil/uL (ref 3.87–5.11)
RDW: 14.3 % (ref 11.5–15.5)
WBC: 7 10*3/uL (ref 4.0–10.5)

## 2023-06-05 LAB — LIPID PANEL
Cholesterol: 98 mg/dL (ref 0–200)
HDL: 57.3 mg/dL (ref >39.00)
LDL Cholesterol: 27 mg/dL (ref 0–99)
NonHDL: 40.89
Total CHOL/HDL Ratio: 2
Triglycerides: 71 mg/dL (ref 0.0–149.0)
VLDL: 14.2 mg/dL (ref 0.0–40.0)

## 2023-06-05 LAB — COMPREHENSIVE METABOLIC PANEL
ALT: 14 U/L (ref 0–35)
AST: 20 U/L (ref 0–37)
Albumin: 4.2 g/dL (ref 3.5–5.2)
Alkaline Phosphatase: 66 U/L (ref 39–117)
BUN: 21 mg/dL (ref 6–23)
CO2: 24 meq/L (ref 19–32)
Calcium: 9.5 mg/dL (ref 8.4–10.5)
Chloride: 103 meq/L (ref 96–112)
Creatinine, Ser: 1.05 mg/dL (ref 0.40–1.20)
GFR: 53.76 mL/min — ABNORMAL LOW (ref >60.00)
Glucose, Bld: 83 mg/dL (ref 70–99)
Potassium: 4.3 meq/L (ref 3.5–5.1)
Sodium: 136 meq/L (ref 135–145)
Total Bilirubin: 0.4 mg/dL (ref 0.2–1.2)
Total Protein: 7.4 g/dL (ref 6.0–8.3)

## 2023-06-05 LAB — TSH: TSH: 2.53 u[IU]/mL (ref 0.35–5.50)

## 2023-06-05 MED ORDER — CARVEDILOL 25 MG PO TABS: 25.0000 mg | ORAL_TABLET | Freq: Two times a day (BID) | ORAL | 3 refills | Status: DC

## 2023-06-05 MED ORDER — AMLODIPINE BESYLATE 10 MG PO TABS: ORAL_TABLET | ORAL | 3 refills | Status: DC

## 2023-06-05 MED ORDER — TOLTERODINE TARTRATE 2 MG PO TABS: 2.0000 mg | ORAL_TABLET | Freq: Two times a day (BID) | ORAL | 11 refills | Status: AC

## 2023-06-05 MED ORDER — OMEPRAZOLE 40 MG PO CPDR: DELAYED_RELEASE_CAPSULE | ORAL | 3 refills | Status: DC

## 2023-06-05 NOTE — Progress Notes (Signed)
Subjective:  Patient ID: Molly Newman, female    DOB: 01/09/1951  Age: 73 y.o. MRN: [id number]  CC: Annual Exam   HPI Molly Newman presents for a well exam Mom is 55 lives w/her  Outpatient Medications Prior to Visit  Medication Sig Dispense Refill   Cholecalciferol (VITAMIN D3) 1000 UNITS tablet Take 1,000 Units by mouth daily.     fexofenadine (ALLEGRA) 180 MG tablet Take 1 tablet (180 mg total) by mouth daily as needed for allergies or rhinitis. 90 tablet 3   molnupiravir EUA (LAGEVRIO) 200 MG CAPS capsule Take 4 capsules by mouth 2 (two) times daily.     nitrofurantoin, macrocrystal-monohydrate, (MACROBID) 100 MG capsule Take 100 mg by mouth.     amLODipine (NORVASC) 10 MG tablet TAKE 1 TABLET EVERY DAY 90 tablet 3   carvedilol (COREG) 25 MG tablet TAKE 1 TABLET TWICE DAILY WITH MEALS 180 tablet 3   omeprazole (PRILOSEC) 40 MG capsule TAKE 1 CAPSULE EVERY DAY 90 capsule 3   tolterodine (DETROL LA) 4 MG 24 hr capsule Take 1 capsule (4 mg total) by mouth as needed. 90 capsule 3   azithromycin (ZITHROMAX Z-PAK) 250 MG tablet As directed (Patient not taking: Reported on 06/05/2023) 6 tablet 0   benzonatate (TESSALON PERLES) 100 MG capsule Take 1 capsule (100 mg total) by mouth 3 (three) times daily as needed. (Patient not taking: Reported on 06/05/2023) 20 capsule 0   No facility-administered medications prior to visit.    ROS: Review of Systems  Constitutional:  Negative for activity change, appetite change, chills, fatigue and unexpected weight change.  HENT:  Negative for congestion, mouth sores and sinus pressure.   Eyes:  Negative for visual disturbance.  Respiratory:  Negative for cough and chest tightness.   Gastrointestinal:  Negative for abdominal pain and nausea.  Genitourinary:  Negative for difficulty urinating, frequency and vaginal pain.  Musculoskeletal:  Negative for back pain and gait problem.  Skin:  Negative for pallor and rash.  Neurological:   Negative for dizziness, tremors, weakness, numbness and headaches.  Psychiatric/Behavioral:  Negative for confusion and sleep disturbance.     Objective:  Ht 5\' 8"  (1.727 m)   BMI 28.74 kg/m   BP Readings from Last 3 Encounters:  05/29/22 118/70  05/09/21 122/76  04/18/20 120/82    Wt Readings from Last 3 Encounters:  08/22/22 189 lb (85.7 kg)  05/29/22 189 lb (85.7 kg)  05/09/21 198 lb 3.2 oz (89.9 kg)    Physical Exam Constitutional:      General: She is not in acute distress.    Appearance: Normal appearance. She is well-developed.  HENT:     Head: Normocephalic.     Right Ear: External ear normal.     Left Ear: External ear normal.     Nose: Nose normal.  Eyes:     General:        Right eye: No discharge.        Left eye: No discharge.     Conjunctiva/sclera: Conjunctivae normal.     Pupils: Pupils are equal, round, and reactive to light.  Neck:     Thyroid: No thyromegaly.     Vascular: No JVD.     Trachea: No tracheal deviation.  Cardiovascular:     Rate and Rhythm: Normal rate and regular rhythm.     Heart sounds: Normal heart sounds.  Pulmonary:     Effort: No respiratory distress.     Breath sounds: No  stridor. No wheezing.  Abdominal:     General: Bowel sounds are normal. There is no distension.     Palpations: Abdomen is soft. There is no mass.     Tenderness: There is no abdominal tenderness. There is no guarding or rebound.  Musculoskeletal:        General: No tenderness.     Cervical back: Normal range of motion and neck supple. No rigidity.  Lymphadenopathy:     Cervical: No cervical adenopathy.  Skin:    Findings: No erythema or rash.  Neurological:     Cranial Nerves: No cranial nerve deficit.     Motor: No abnormal muscle tone.     Coordination: Coordination normal.     Deep Tendon Reflexes: Reflexes normal.  Psychiatric:        Behavior: Behavior normal.        Thought Content: Thought content normal.        Judgment: Judgment  normal.     Lab Results  Component Value Date   WBC 8.5 05/29/2022   HGB 10.8 (L) 05/29/2022   HCT 32.9 (L) 05/29/2022   PLT 257.0 05/29/2022   GLUCOSE 87 05/29/2022   CHOL 89 05/29/2022   TRIG 84.0 05/29/2022   HDL 44.40 05/29/2022   LDLCALC 28 05/29/2022   ALT 12 05/29/2022   AST 18 05/29/2022   NA 137 05/29/2022   K 4.1 05/29/2022   CL 103 05/29/2022   CREATININE 0.99 05/29/2022   BUN 17 05/29/2022   CO2 26 05/29/2022   TSH 2.73 05/29/2022   HGBA1C 6.1 04/15/2019    US RENAL Result Date: 05/11/2020 CLINICAL DATA:  Decreased GFR Recurring UTIs EXAM: RENAL / URINARY TRACT ULTRASOUND COMPLETE COMPARISON:  None. FINDINGS: Right Kidney: Renal measurements: 9.5 x 3.4 x 5.4 = volume: 92 mL. Echogenicity within normal limits. No mass or hydronephrosis visualized. Left Kidney: Renal measurements: 9.3 x 4.6 x 5.5 = volume: 123 mL. Echogenicity within normal limits. No mass or hydronephrosis visualized. Bladder: Evaluation of the bladder is limited due to under distension. No significant abnormality identified. Bilateral ureteral jets are noted. Other: None. IMPRESSION: No significant abnormality of the kidneys or bladder. Electronically Signed   By: Acquanetta Belling M.D.   On: 05/11/2020 13:24    Assessment & Plan:   Problem List Items Addressed This Visit     Essential hypertension   Cont on Amlodipine, Coreg      Relevant Medications   amLODipine (NORVASC) 10 MG tablet   carvedilol (COREG) 25 MG tablet   Other Relevant Orders   Comprehensive metabolic panel   Well adult exam - Primary    We discussed age appropriate health related issues, including available/recomended screening tests and vaccinations. Labs were ordered to be later reviewed . All questions were answered. We discussed one or more of the following - seat belt use, use of sunscreen/sun exposure exercise, fall risk reduction, second hand smoke exposure, firearm use and storage, seat belt use, a need for adhering to  healthy diet and exercise. Labs were ordered.  All questions were answered. Colon due 2022 - polyps (Dr Rhea Belton). Past due. The pt is arranging for colonoscopy - she needs a sitter for her mother Eye exam q 12 mo Mammo, PAP Pt lives w/mom - 41 yo w/dementia; retired Pt declined pneumonia vaccine Considering shingrix      Relevant Orders   TSH   Urinalysis   CBC with Differential/Platelet   Lipid panel   Comprehensive metabolic panel  Renal insufficiency   Monitor GFR      Colon polyps   Colon due 2022 - polyps (Dr Rhea Belton). Past due. The pt is arranging for colonoscopy - she needs a sitter for her mother      Relevant Orders   Ambulatory referral to Gastroenterology   Other Visit Diagnoses       Dyslipidemia       Relevant Orders   TSH   Lipid panel         Meds ordered this encounter  Medications   tolterodine (DETROL) 2 MG tablet    Sig: Take 1 tablet (2 mg total) by mouth 2 (two) times daily.    Dispense:  60 tablet    Refill:  11   amLODipine (NORVASC) 10 MG tablet    Sig: TAKE 1 TABLET EVERY DAY    Dispense:  90 tablet    Refill:  3   carvedilol (COREG) 25 MG tablet    Sig: Take 1 tablet (25 mg total) by mouth 2 (two) times daily with a meal.    Dispense:  180 tablet    Refill:  3   omeprazole (PRILOSEC) 40 MG capsule    Sig: TAKE 1 CAPSULE EVERY DAY    Dispense:  90 capsule    Refill:  3      Follow-up: Return in about 1 year (around 06/04/2024) for Wellness Exam.  Sonda Primes, MD

## 2023-06-05 NOTE — Assessment & Plan Note (Signed)
Colon due 2022 - polyps (Dr Rhea Belton). Past due. The pt is arranging for colonoscopy - she needs a sitter for her mother

## 2023-06-05 NOTE — Assessment & Plan Note (Signed)
Monitor GFR

## 2023-06-05 NOTE — Assessment & Plan Note (Signed)
Cont on Amlodipine, Coreg

## 2023-06-05 NOTE — Assessment & Plan Note (Addendum)
  We discussed age appropriate health related issues, including available/recomended screening tests and vaccinations. Labs were ordered to be later reviewed . All questions were answered. We discussed one or more of the following - seat belt use, use of sunscreen/sun exposure exercise, fall risk reduction, second hand smoke exposure, firearm use and storage, seat belt use, a need for adhering to healthy diet and exercise. Labs were ordered.  All questions were answered. Colon due 2022 - polyps (Dr Rhea Belton). Past due. The pt is arranging for colonoscopy - she needs a sitter for her mother Eye exam q 12 mo Mammo, PAP Pt lives w/mom - 35 yo w/dementia; retired Pt declined pneumonia vaccine Considering shingrix

## 2023-06-10 ENCOUNTER — Telehealth: Payer: Self-pay | Admitting: Internal Medicine

## 2023-06-10 NOTE — Telephone Encounter (Unsigned)
Copied from CRM [id number]. Topic: Clinical - Lab/Test Results >> Jun 10, 2023 11:51 AM Aletta Edouard wrote: Reason for CRM: patient would like a call back regarding her test results

## 2023-06-11 NOTE — Telephone Encounter (Signed)
 Copied from CRM [id number]. Topic: Clinical - Lab/Test Results >> Jun 11, 2023  2:52 PM Melissa C wrote: Reason for CRM: patient calling again requesting lab/test results from the nurse. She said she will be going out in a few minutes and if you are able you can leave a message on her answering machine and if not then please try her tomorrow. Thank you.

## 2023-06-12 NOTE — Telephone Encounter (Signed)
I was able to speak with the pt and inform her of her results a/w asking about PCP concerns in which pt denied any of them.

## 2023-06-19 DIAGNOSIS — Z01419 Encounter for gynecological examination (general) (routine) without abnormal findings: Secondary | ICD-10-CM | POA: Diagnosis not present

## 2023-06-19 DIAGNOSIS — Z13820 Encounter for screening for osteoporosis: Secondary | ICD-10-CM | POA: Diagnosis not present

## 2023-08-16 DIAGNOSIS — N958 Other specified menopausal and perimenopausal disorders: Secondary | ICD-10-CM | POA: Diagnosis not present

## 2023-08-16 DIAGNOSIS — E2839 Other primary ovarian failure: Secondary | ICD-10-CM | POA: Diagnosis not present

## 2023-08-26 ENCOUNTER — Ambulatory Visit (INDEPENDENT_AMBULATORY_CARE_PROVIDER_SITE_OTHER): Payer: Medicare HMO

## 2023-08-26 VITALS — Ht 68.0 in | Wt 173.0 lb

## 2023-08-26 DIAGNOSIS — K635 Polyp of colon: Secondary | ICD-10-CM | POA: Diagnosis not present

## 2023-08-26 DIAGNOSIS — Z0000 Encounter for general adult medical examination without abnormal findings: Secondary | ICD-10-CM | POA: Diagnosis not present

## 2023-08-26 NOTE — Patient Instructions (Addendum)
 Ms. Pieczynski , Thank you for taking time to come for your Medicare Wellness Visit. I appreciate your ongoing commitment to your health goals. Please review the following plan we discussed and let me know if I can assist you in the future.   Referrals/Orders/Follow-Ups/Clinician Recommendations: Aim for 30 minutes of exercise or brisk walking, 6-8 glasses of water, and 5 servings of fruits and vegetables each day. Educated and advised on getting the COVID, Pneumonia, and Shingles vaccines in 2025.  Referral to Dr Bridgett Camps for a repeat Colonoscopy.    This is a list of the screening recommended for you and due dates:  Health Maintenance  Topic Date Due   Pneumonia Vaccine (1 of 1 - PCV) Never done   Zoster (Shingles) Vaccine (1 of 2) Never done   DEXA scan (bone density measurement)  Never done   Colon Cancer Screening  08/19/2020   COVID-19 Vaccine (6 - 2024-25 season) 01/06/2023   Mammogram  05/04/2023   Flu Shot  12/06/2023   Medicare Annual Wellness Visit  08/25/2024   DTaP/Tdap/Td vaccine (2 - Td or Tdap) 01/02/2025   Hepatitis C Screening  Completed   HPV Vaccine  Aged Out   Meningitis B Vaccine  Aged Out    Advanced directives: (Provided) Advance directive discussed with you today. I have provided a copy for you to complete at home and have notarized. Once this is complete, please bring a copy in to our office so we can scan it into your chart.   Next Medicare Annual Wellness Visit scheduled for next year: Yes

## 2023-08-26 NOTE — Progress Notes (Cosign Needed)
 Subjective:   Molly Newman is a 73 y.o. who presents for a Medicare Wellness preventive visit.  Visit Complete: Virtual I connected with  Adelle Agent on 08/26/23 by a audio enabled telemedicine application and verified that I am speaking with the correct person using two identifiers.  Patient Location: Home  Provider Location: Office/Clinic  I discussed the limitations of evaluation and management by telemedicine. The patient expressed understanding and agreed to proceed.  Vital Signs: Because this visit was a virtual/telehealth visit, some criteria may be missing or patient reported. Any vitals not documented were not able to be obtained and vitals that have been documented are patient reported.  VideoDeclined- This patient declined Librarian, academic. Therefore the visit was completed with audio only.  Persons Participating in Visit: Patient.  AWV Questionnaire: No: Patient Medicare AWV questionnaire was not completed prior to this visit.  Cardiac Risk Factors include: advanced age (>60men, >42 women);hypertension     Objective:    Today's Vitals   08/26/23 1432  Weight: 173 lb (78.5 kg)  Height: 5\' 8"  (1.727 m)   Body mass index is 26.3 kg/m.     08/26/2023    2:31 PM 08/22/2022    2:19 PM 08/19/2017    7:34 AM  Advanced Directives  Does Patient Have a Medical Advance Directive? No No No  Would patient like information on creating a medical advance directive? Yes (MAU/Ambulatory/Procedural Areas - Information given) Yes (MAU/Ambulatory/Procedural Areas - Information given) No - Patient declined    Current Medications (verified) Outpatient Encounter Medications as of 08/26/2023  Medication Sig   amLODipine  (NORVASC ) 10 MG tablet TAKE 1 TABLET EVERY DAY   carvedilol  (COREG ) 25 MG tablet Take 1 tablet (25 mg total) by mouth 2 (two) times daily with a meal.   Cholecalciferol (VITAMIN D3) 1000 UNITS tablet Take 1,000 Units by  mouth daily.   fexofenadine  (ALLEGRA ) 180 MG tablet Take 1 tablet (180 mg total) by mouth daily as needed for allergies or rhinitis.   molnupiravir  EUA (LAGEVRIO ) 200 MG CAPS capsule Take 4 capsules by mouth 2 (two) times daily.   nitrofurantoin , macrocrystal-monohydrate, (MACROBID ) 100 MG capsule Take 100 mg by mouth.   omeprazole  (PRILOSEC) 40 MG capsule TAKE 1 CAPSULE EVERY DAY   tolterodine  (DETROL ) 2 MG tablet Take 1 tablet (2 mg total) by mouth 2 (two) times daily.   No facility-administered encounter medications on file as of 08/26/2023.    Allergies (verified) Ace inhibitors and Diltiazem hcl   History: Past Medical History:  Diagnosis Date   Allergy    Cancer (HCC)    30 years ago   GERD (gastroesophageal reflux disease)    Heart murmur    as a child   Hypertension    History reviewed. No pertinent surgical history. Family History  Problem Relation Age of Onset   Hypertension Other    Colon polyps Brother    Stroke Sister    Colon cancer Neg Hx    Esophageal cancer Neg Hx    Rectal cancer Neg Hx    Stomach cancer Neg Hx    Social History   Socioeconomic History   Marital status: Single    Spouse name: Not on file   Number of children: Not on file   Years of education: Not on file   Highest education level: Not on file  Occupational History   Not on file  Tobacco Use   Smoking status: Never    Passive exposure:  Never   Smokeless tobacco: Never  Vaping Use   Vaping status: Never Used  Substance and Sexual Activity   Alcohol use: Yes    Comment: occasionally   Drug use: No   Sexual activity: Not on file  Other Topics Concern   Not on file  Social History Narrative   Single   Social Drivers of Health   Financial Resource Strain: Low Risk  (08/26/2023)   Overall Financial Resource Strain (CARDIA)    Difficulty of Paying Living Expenses: Not hard at all  Food Insecurity: No Food Insecurity (08/26/2023)   Hunger Vital Sign    Worried About Running  Out of Food in the Last Year: Never true    Ran Out of Food in the Last Year: Never true  Transportation Needs: No Transportation Needs (08/26/2023)   PRAPARE - Administrator, Civil Service (Medical): No    Lack of Transportation (Non-Medical): No  Physical Activity: Insufficiently Active (08/26/2023)   Exercise Vital Sign    Days of Exercise per Week: 3 days    Minutes of Exercise per Session: 20 min  Stress: No Stress Concern Present (08/26/2023)   [employer] of Occupational Health - Occupational Stress Questionnaire    Feeling of Stress : Only a little  Social Connections: Socially Isolated (08/26/2023)   Social Connection and Isolation Panel [NHANES]    Frequency of Communication with Friends and Family: More than three times a week    Frequency of Social Gatherings with Friends and Family: Once a week    Attends Religious Services: Never    Database administrator or Organizations: No    Attends Engineer, structural: Never    Marital Status: Never married    Tobacco Counseling Counseling given: No    Clinical Intake:  Pre-visit preparation completed: Yes  Pain : No/denies pain     BMI - recorded: 26.3 Nutritional Risks: None Diabetes: No  Lab Results  Component Value Date   HGBA1C 6.1 04/15/2019   HGBA1C 6.3 01/21/2018   HGBA1C 6.4 12/05/2012     How often do you need to have someone help you when you read instructions, pamphlets, or other written materials from your doctor or pharmacy?: 1 - Never  Interpreter Needed?: No  Information entered by :: Kandy Orris, CMA   Activities of Daily Living     08/26/2023    2:34 PM  In your present state of health, do you have any difficulty performing the following activities:  Hearing? 0  Vision? 0  Difficulty concentrating or making decisions? 0  Walking or climbing stairs? 0  Dressing or bathing? 0  Doing errands, shopping? 0  Preparing Food and eating ? N  Using the Toilet? N  In  the past six months, have you accidently leaked urine? Y  Comment wears pantyliner  Do you have problems with loss of bowel control? N  Managing your Medications? N  Managing your Finances? N  Housekeeping or managing your Housekeeping? N    Patient Care Team: Plotnikov, Oakley Bellman, MD as PCP - Alaina Aline, MD (Inactive) as Attending Physician (Obstetrics and Gynecology) Gwyndolyn Lerner, MD (Inactive) as Attending Physician (Internal Medicine) Pyrtle, Amber Bail, MD as Consulting Physician (Gastroenterology) Renea Carrion, MD as Consulting Physician (Obstetrics and Gynecology)  Indicate any recent Medical Services you may have received from other than [facility] providers in the past year (date may be approximate).     Assessment:   This is a routine  wellness examination for Jordan.  Hearing/Vision screen Hearing Screening - Comments:: Denies hearing difficulties   Vision Screening - Comments:: Wears rx glasses - up to date with routine eye exams with [facility] - Dr Merlyn Starring   Goals Addressed               This Visit's Progress     Patient Stated (pt-stated)        Patient stated she plans to stay active and healthy to care for her mother.        Depression Screen     08/26/2023    2:36 PM 06/05/2023    1:34 PM 08/22/2022    2:21 PM 05/29/2022    2:34 PM 05/09/2021    1:49 PM 04/18/2020   10:08 AM  PHQ 2/9 Scores  PHQ - 2 Score 0 0 0 0 0 0  PHQ- 9 Score 0  1  0     Fall Risk     08/26/2023    2:35 PM 06/05/2023    1:34 PM 08/22/2022    2:21 PM 05/29/2022    2:34 PM 05/09/2021    1:49 PM  Fall Risk   Falls in the past year? 0 0 0 0 0  Number falls in past yr: 0  0 0 0  Injury with Fall? 0 0 0 0 0  Risk for fall due to : No Fall Risks No Fall Risks No Fall Risks No Fall Risks No Fall Risks  Follow up Falls prevention discussed;Falls evaluation completed Falls evaluation completed Falls prevention discussed Falls evaluation completed     MEDICARE RISK AT HOME:   Medicare Risk at Home Any stairs in or around the home?: No If so, are there any without handrails?: No Home free of loose throw rugs in walkways, pet beds, electrical cords, etc?: Yes Adequate lighting in your home to reduce risk of falls?: Yes Life alert?: No Use of a cane, walker or w/c?: No Grab bars in the bathroom?: Yes Shower chair or bench in shower?: Yes Elevated toilet seat or a handicapped toilet?: Yes  TIMED UP AND GO:  Was the test performed?  No  Cognitive Function: 6CIT completed        08/26/2023    2:39 PM 08/22/2022    2:21 PM  6CIT Screen  What Year? 0 points 0 points  What month? 0 points 0 points  What time? 0 points 0 points  Count back from 20 0 points 0 points  Months in reverse 0 points 0 points  Repeat phrase 0 points 0 points  Total Score 0 points 0 points    Immunizations Immunization History  Administered Date(s) Administered   Fluad Quad(high Dose 65+) 02/25/2020, 03/01/2021   Influenza, High Dose Seasonal PF 03/24/2019   Influenza,inj,Quad PF,6+ Mos 07/05/2015, 01/10/2017, 02/20/2018   Influenza-Unspecified 06/29/2016   PFIZER Comirnaty(Gray Top)Covid-19 Tri-Sucrose Vaccine 02/28/2022   PFIZER(Purple Top)SARS-COV-2 Vaccination 07/09/2019, 08/05/2019, 05/04/2020, 12/30/2020   Tdap 01/03/2015    Screening Tests Health Maintenance  Topic Date Due   Pneumonia Vaccine 56+ Years old (1 of 1 - PCV) Never done   Zoster Vaccines- Shingrix (1 of 2) Never done   DEXA SCAN  Never done   Colonoscopy  08/19/2020   COVID-19 Vaccine (6 - 2024-25 season) 01/06/2023   MAMMOGRAM  05/04/2023   INFLUENZA VACCINE  12/06/2023   Medicare Annual Wellness (AWV)  08/25/2024   DTaP/Tdap/Td (2 - Td or Tdap) 01/02/2025   Hepatitis C Screening  Completed   HPV VACCINES  Aged Out   Meningococcal B Vaccine  Aged Out    Health Maintenance  Health Maintenance Due  Topic Date Due   Pneumonia Vaccine 27+ Years old (1 of 1 - PCV) Never done   Zoster  Vaccines- Shingrix (1 of 2) Never done   DEXA SCAN  Never done   Colonoscopy  08/19/2020   COVID-19 Vaccine (6 - 2024-25 season) 01/06/2023   MAMMOGRAM  05/04/2023   Health Maintenance Items Addressed:  Referral sent to GI for colonoscopy   Additional Screening:  Vision Screening: Recommended annual ophthalmology exams for early detection of glaucoma and other disorders of the eye. Pt stated she sees Dr Merlyn Starring at [facility]  Dental Screening: Recommended annual dental exams for proper oral hygiene  Community Resource Referral / Chronic Care Management: CRR required this visit?  No   CCM required this visit?  No     Plan:     I have personally reviewed and noted the following in the patient's chart:   Medical and social history Use of alcohol, tobacco or illicit drugs  Current medications and supplements including opioid prescriptions. Patient is not currently taking opioid prescriptions. Functional ability and status Nutritional status Physical activity Advanced directives List of other physicians Hospitalizations, surgeries, and ER visits in previous 12 months Vitals Screenings to include cognitive, depression, and falls Referrals and appointments  In addition, I have reviewed and discussed with patient certain preventive protocols, quality metrics, and best practice recommendations. A written personalized care plan for preventive services as well as general preventive health recommendations were provided to patient.     Patria Bookbinder, CMA   08/26/2023   After Visit Summary: (MyChart) Due to this being a telephonic visit, the after visit summary with patients personalized plan was offered to patient via MyChart   Notes: Nothing significant to report at this time.  Medical screening examination/treatment/procedure(s) were performed by non-physician practitioner and as supervising physician I was immediately available for consultation/collaboration.  I agree  with above. Adelaide Holy, MD

## 2024-06-01 ENCOUNTER — Other Ambulatory Visit: Payer: Self-pay | Admitting: Internal Medicine

## 2024-06-24 ENCOUNTER — Encounter: Admitting: Internal Medicine

## 2024-08-26 ENCOUNTER — Ambulatory Visit
